# Patient Record
Sex: Female | Born: 1995 | Race: Black or African American | Hispanic: No | Marital: Single | Smoking: Never smoker
Health system: Southern US, Community
[De-identification: ages and names within clinical notes are randomized; demographics above are authoritative.]

## PROBLEM LIST (undated history)

## (undated) ENCOUNTER — Inpatient Hospital Stay (HOSPITAL_COMMUNITY): Payer: Self-pay

## (undated) DIAGNOSIS — E669 Obesity, unspecified: Secondary | ICD-10-CM

## (undated) DIAGNOSIS — G43909 Migraine, unspecified, not intractable, without status migrainosus: Secondary | ICD-10-CM

## (undated) DIAGNOSIS — G932 Benign intracranial hypertension: Secondary | ICD-10-CM

## (undated) HISTORY — DX: Migraine, unspecified, not intractable, without status migrainosus: G43.909

## (undated) HISTORY — DX: Benign intracranial hypertension: G93.2

## (undated) HISTORY — PX: OTHER SURGICAL HISTORY: SHX169

## (undated) HISTORY — DX: Obesity, unspecified: E66.9

---

## 2015-11-02 ENCOUNTER — Emergency Department (HOSPITAL_COMMUNITY): Payer: Managed Care, Other (non HMO)

## 2015-11-02 ENCOUNTER — Encounter (HOSPITAL_COMMUNITY): Payer: Self-pay | Admitting: Emergency Medicine

## 2015-11-02 ENCOUNTER — Emergency Department (HOSPITAL_COMMUNITY)
Admission: EM | Admit: 2015-11-02 | Discharge: 2015-11-03 | Disposition: A | Discharge status: Home / Self Care | Payer: Managed Care, Other (non HMO) | Attending: Emergency Medicine | Admitting: Emergency Medicine

## 2015-11-02 DIAGNOSIS — R51 Headache: Secondary | ICD-10-CM | POA: Insufficient documentation

## 2015-11-02 DIAGNOSIS — M542 Cervicalgia: Secondary | ICD-10-CM | POA: Insufficient documentation

## 2015-11-02 DIAGNOSIS — R202 Paresthesia of skin: Secondary | ICD-10-CM | POA: Insufficient documentation

## 2015-11-02 DIAGNOSIS — R519 Headache, unspecified: Secondary | ICD-10-CM

## 2015-11-02 DIAGNOSIS — R200 Anesthesia of skin: Secondary | ICD-10-CM | POA: Insufficient documentation

## 2015-11-02 LAB — I-STAT CHEM 8, ED
BUN: 18 mg/dL (ref 6–20)
CALCIUM ION: 1.23 mmol/L (ref 1.12–1.23)
Chloride: 102 mmol/L (ref 101–111)
Creatinine, Ser: 0.9 mg/dL (ref 0.44–1.00)
GLUCOSE: 82 mg/dL (ref 65–99)
HCT: 44 % (ref 36.0–46.0)
HEMOGLOBIN: 15 g/dL (ref 12.0–15.0)
POTASSIUM: 3.7 mmol/L (ref 3.5–5.1)
Sodium: 142 mmol/L (ref 135–145)
TCO2: 25 mmol/L (ref 0–100)

## 2015-11-02 MED ORDER — DIAZEPAM 5 MG/ML IJ SOLN
2.5000 mg | Freq: Once | INTRAMUSCULAR | Status: AC
  Administered 2015-11-02: 2.5 mg via INTRAVENOUS
  Filled 2015-11-02: qty 2

## 2015-11-02 MED ORDER — DEXAMETHASONE SODIUM PHOSPHATE 10 MG/ML IJ SOLN
10.0000 mg | Freq: Once | INTRAMUSCULAR | Status: AC
  Administered 2015-11-03: 10 mg via INTRAVENOUS
  Filled 2015-11-02: qty 1

## 2015-11-02 MED ORDER — METOCLOPRAMIDE HCL 5 MG/ML IJ SOLN
10.0000 mg | Freq: Once | INTRAMUSCULAR | Status: AC
  Administered 2015-11-03: 10 mg via INTRAVENOUS
  Filled 2015-11-02: qty 2

## 2015-11-02 MED ORDER — DIPHENHYDRAMINE HCL 50 MG/ML IJ SOLN
25.0000 mg | Freq: Once | INTRAMUSCULAR | Status: AC
  Administered 2015-11-03: 25 mg via INTRAVENOUS
  Filled 2015-11-02: qty 1

## 2015-11-02 MED ORDER — SODIUM CHLORIDE 0.9 % IV BOLUS (SEPSIS)
1000.0000 mL | Freq: Once | INTRAVENOUS | Status: AC
  Administered 2015-11-03: 1000 mL via INTRAVENOUS

## 2015-11-02 MED ORDER — GADOBENATE DIMEGLUMINE 529 MG/ML IV SOLN
15.0000 mL | Freq: Once | INTRAVENOUS | Status: AC | PRN
  Administered 2015-11-02: 15 mL via INTRAVENOUS

## 2015-11-02 NOTE — ED Notes (Signed)
Pt sts HA with numbness down right arm upon waking today

## 2015-11-02 NOTE — ED Notes (Signed)
Patient transported to MRI 

## 2015-11-02 NOTE — ED Provider Notes (Signed)
CSN: [id number]     Arrival date & time 11/02/15  1711 History   First MD Initiated Contact with Patient 11/02/15 2046     Chief Complaint  Patient presents with  . Headache     (Consider location/radiation/quality/duration/timing/severity/associated sxs/prior Treatment) Patient is a 20 y.o. female presenting with headaches. The history is provided by the patient.  Headache Pain location:  Generalized Radiates to:  Does not radiate Severity currently:  8/10 Severity at highest:  8/10 Onset quality:  Sudden Timing:  Constant Progression:  Unchanged Chronicity:  New Similar to prior headaches: yes   Relieved by:  Nothing Worsened by:  Nothing Ineffective treatments:  None tried Associated symptoms: neck pain   Associated symptoms: no abdominal pain, no back pain, no congestion, no cough, no fatigue, no fever, no nausea, no neck stiffness, no vomiting and no weakness   Risk factors: no family hx of SAH     History reviewed. No pertinent past medical history. History reviewed. No pertinent past surgical history. History reviewed. No pertinent family history. Social History  Substance Use Topics  . Smoking status: Never Smoker   . Smokeless tobacco: None  . Alcohol Use: No   OB History    No data available     Review of Systems  Constitutional: Negative for fever and fatigue.  HENT: Negative for congestion and rhinorrhea.   Eyes: Negative for visual disturbance.  Respiratory: Negative for cough and shortness of breath.   Cardiovascular: Negative for chest pain and leg swelling.  Gastrointestinal: Negative for nausea, vomiting and abdominal pain.  Genitourinary: Negative for dysuria and flank pain.  Musculoskeletal: Positive for neck pain. Negative for back pain and neck stiffness.  Skin: Negative for rash.  Allergic/Immunologic: Negative for immunocompromised state.  Neurological: Positive for headaches. Negative for syncope and weakness.      Allergies  Review  of patient's allergies indicates no known allergies.  Home Medications   Prior to Admission medications   Not on File   BP 108/70 mmHg  Pulse 68  Temp(Src) 98.2 F (36.8 C) (Oral)  Resp 18  SpO2 97%  LMP 10/12/2015 Physical Exam  Constitutional: She is oriented to person, place, and time. She appears well-developed and well-nourished. No distress.  HENT:  Head: Normocephalic.  Mouth/Throat: No oropharyngeal exudate.  Eyes: Conjunctivae are normal. Pupils are equal, round, and reactive to light.  Neck: Normal range of motion. Neck supple.  Cardiovascular: Normal rate, regular rhythm, normal heart sounds and intact distal pulses.  Exam reveals no friction rub.   No murmur heard. Pulmonary/Chest: Effort normal and breath sounds normal. No respiratory distress. She has no wheezes. She has no rales.  Abdominal: Soft. Bowel sounds are normal. She exhibits no distension. There is no tenderness.  Musculoskeletal: She exhibits no edema.  Moderate tenderness to palpation of her right paraspinal muscles and superior trapezius. No midline tenderness or deformity.  Neurological: She is alert and oriented to person, place, and time.  Skin: Skin is warm. No rash noted.  Nursing note and vitals reviewed.   Neurological Exam:  - Mental Status: Alert and oriented to person, place, and time. Attention and concentration normal. Speech clear. Recent memory is intact. - Cranial Nerves: Visual fields intact to confrontation in all quadrants bilaterally. EOMI and PERRLA. No nystagmus noted. Facial sensation intact at forehead, maxillary cheek, and chin/mandible bilaterally. No weakness of masticatory muscles. No facial asymmetry or weakness. Hearing grossly normal to finer rub. Uvula is midline, and palate elevates symmetrically. Normal  SCM and trapezius strength. Tongue midline without fasciculations - Motor: Muscle strength 5/5 in proximal and distal UE and LE bilaterally. No pronator drift. Muscle tone  normal. - Reflexes: 2+ and symmetrical in all four extremities.  - Sensation: Intact to light touch and pinprick in upper and lower extremities distally bilaterally.  - Gait: Normal without ataxia. - Coordination: Normal FTN and HTS bilaterally.   ED Course  Procedures (including critical care time) Labs Review Labs Reviewed  I-STAT CHEM 8, ED  POC URINE PREG, ED    Imaging Review Mr Laqueta Jean Wo Contrast  11/02/2015  CLINICAL DATA:  Headache with numbness and tingling of RIGHT arm. EXAM: MRI HEAD WITHOUT AND WITH CONTRAST MRI CERVICAL SPINE WITHOUT AND WITH CONTRAST TECHNIQUE: Multiplanar, multiecho pulse sequences of the brain and surrounding structures, and cervical spine, to include the craniocervical junction and cervicothoracic junction, were obtained without and with intravenous contrast. CONTRAST:  15mL MULTIHANCE GADOBENATE DIMEGLUMINE 529 MG/ML IV SOLN COMPARISON:  None. FINDINGS: MRI HEAD FINDINGS The ventricles and sulci are normal for patient's age. No abnormal parenchymal signal, mass lesions, mass effect. No abnormal parenchymal enhancement. No reduced diffusion to suggest acute ischemia. No susceptibility artifact to suggest hemorrhage. No abnormal extra-axial fluid collections. No extra-axial masses nor leptomeningeal enhancement. Normal major intracranial vascular flow voids seen at the skull base. Somewhat patulous optic nerve sheaths, and slight flattening of the optic discs. No suspicious calvarial bone marrow signal. No abnormal sellar expansion though, there is a convex margin of the pituitary gland. Craniocervical junction maintained. Moderate paranasal sinus mucosal thickening. Mastoid air cells are well aerated. MRI CERVICAL SPINE FINDINGS Cervical vertebral bodies and posterior elements appear intact and aligned with straightened cervical lordosis. Intervertebral discs demonstrate normal morphology and signal characteristics. No abnormal osseous or intradiscal enhancement.  Cervical spinal cord is normal morphology and signal from the craniocervical junction to level of T3-4, the most caudal well visualized level. No abnormal cord, leptomeningeal or epidural enhancement. Scattered prominent cervical lymph nodes are likely reactive. C2-3 through C7-T1: No disc bulge, canal stenosis or neural foraminal narrowing. IMPRESSION: MRI BRAIN: Borderline findings for intracranial hypertension. Otherwise normal MRI of the brain with and without contrast. Moderate paranasal sinusitis. MRI CERVICAL SPINE:  Normal. Electronically Signed   By: Awilda Metro M.D.   On: 11/02/2015 23:26   Mr Cervical Spine W Wo Contrast  11/02/2015  CLINICAL DATA:  Headache with numbness and tingling of RIGHT arm. EXAM: MRI HEAD WITHOUT AND WITH CONTRAST MRI CERVICAL SPINE WITHOUT AND WITH CONTRAST TECHNIQUE: Multiplanar, multiecho pulse sequences of the brain and surrounding structures, and cervical spine, to include the craniocervical junction and cervicothoracic junction, were obtained without and with intravenous contrast. CONTRAST:  15mL MULTIHANCE GADOBENATE DIMEGLUMINE 529 MG/ML IV SOLN COMPARISON:  None. FINDINGS: MRI HEAD FINDINGS The ventricles and sulci are normal for patient's age. No abnormal parenchymal signal, mass lesions, mass effect. No abnormal parenchymal enhancement. No reduced diffusion to suggest acute ischemia. No susceptibility artifact to suggest hemorrhage. No abnormal extra-axial fluid collections. No extra-axial masses nor leptomeningeal enhancement. Normal major intracranial vascular flow voids seen at the skull base. Somewhat patulous optic nerve sheaths, and slight flattening of the optic discs. No suspicious calvarial bone marrow signal. No abnormal sellar expansion though, there is a convex margin of the pituitary gland. Craniocervical junction maintained. Moderate paranasal sinus mucosal thickening. Mastoid air cells are well aerated. MRI CERVICAL SPINE FINDINGS Cervical  vertebral bodies and posterior elements appear intact and aligned with straightened cervical  lordosis. Intervertebral discs demonstrate normal morphology and signal characteristics. No abnormal osseous or intradiscal enhancement. Cervical spinal cord is normal morphology and signal from the craniocervical junction to level of T3-4, the most caudal well visualized level. No abnormal cord, leptomeningeal or epidural enhancement. Scattered prominent cervical lymph nodes are likely reactive. C2-3 through C7-T1: No disc bulge, canal stenosis or neural foraminal narrowing. IMPRESSION: MRI BRAIN: Borderline findings for intracranial hypertension. Otherwise normal MRI of the brain with and without contrast. Moderate paranasal sinusitis. MRI CERVICAL SPINE:  Normal. Electronically Signed   By: Awilda Metroourtnay  Bloomer M.D.   On: 11/02/2015 23:26   I have personally reviewed and evaluated these images and lab results as part of my medical decision-making.   EKG Interpretation None      MDM   20 yo F with PMHx of neck pain x 1 year, intermittent HA who p/w generalized HA, right-sided neck pain and paresthesias down RUE. VSS and WNL. Exam as above, with no apparent focal neuro deficits. Pt does have moderate TTP over right paraspinal cervical muscles. At this time, DDx includes: complex/atypical migraine, cervical radiculopathy, paraspinal muscles spasm with brachial plexus irritation. No fevers, photophobia, and pt o/w well appearing - do not suspect meningitis or encephalitis. HA began gradually, pt well appearing, doubt SAH. Given age, new onset HA and neuro deficits, will check MRI Head/C-Spine.  MR Brain and C-Spine shows no acute abnormality. Pt with borderline findings of IIH. Vision normal bilaterally. Sx improved with migraine cocktail. Suspect possible complex migraine, muscle spasm, versus mild IIH though no other findings to suggest IIH at his time. This was discussed with pt and family and will plan to refer  to outpt neurologist for further work-up. Pt advised to return with any new neuro sx, vision changes, fever, etc.   Clinical Impression: 1. Nonintractable headache   2. Numbness and tingling of right arm     Disposition: Discharge  Condition: Good  I have discussed the results, Dx and Tx plan with the pt(& family if present). He/she/they expressed understanding and agree(s) with the plan. Discharge instructions discussed at great length. Strict return precautions discussed and pt &/or family have verbalized understanding of the instructions. No further questions at time of discharge.    There are no discharge medications for this patient.   [facility] [address]     [address] [place] [id number] [phone]  Call to set up an appointment to discuss your MRI findings and chronic headaches   Pt seen in conjunction with Dr. Selena LesserSchlossman   Magaret Justo, MD 11/03/15 1239  Alvira MondayErin Schlossman, MD 11/09/15 1458

## 2015-11-03 NOTE — ED Notes (Signed)
MD at bedside. 

## 2015-11-07 ENCOUNTER — Ambulatory Visit (INDEPENDENT_AMBULATORY_CARE_PROVIDER_SITE_OTHER): Payer: Managed Care, Other (non HMO) | Admitting: Neurology

## 2015-11-07 ENCOUNTER — Encounter: Payer: Self-pay | Admitting: Neurology

## 2015-11-07 VITALS — BP 114/69 | HR 90 | Ht 63.0 in | Wt 189.5 lb

## 2015-11-07 DIAGNOSIS — G441 Vascular headache, not elsewhere classified: Secondary | ICD-10-CM

## 2015-11-07 DIAGNOSIS — R51 Headache: Secondary | ICD-10-CM

## 2015-11-07 DIAGNOSIS — R519 Headache, unspecified: Secondary | ICD-10-CM | POA: Insufficient documentation

## 2015-11-07 MED ORDER — TOPIRAMATE 25 MG PO TABS: ORAL_TABLET | ORAL | Status: DC

## 2015-11-07 MED ORDER — RIZATRIPTAN BENZOATE 10 MG PO TBDP: 10.0000 mg | ORAL_TABLET | Freq: Three times a day (TID) | ORAL | Status: DC | PRN

## 2015-11-07 NOTE — Patient Instructions (Signed)
 Topamax (topiramate) is a seizure medication that has an FDA approval for seizures and for migraine headache. Potential side effects of this medication include weight loss, cognitive slowing, tingling in the fingers and toes, and carbonated drinks will taste bad. If any significant side effects are noted on this drug, please contact our office.  Migraine Headache A migraine headache is an intense, throbbing pain on one or both sides of your head. A migraine can last for 30 minutes to several hours. CAUSES  The exact cause of a migraine headache is not always known. However, a migraine may be caused when nerves in the brain become irritated and release chemicals that cause inflammation. This causes pain. Certain things may also trigger migraines, such as:  Alcohol.  Smoking.  Stress.  Menstruation.  Aged cheeses.  Foods or drinks that contain nitrates, glutamate, aspartame, or tyramine.  Lack of sleep.  Chocolate.  Caffeine.  Hunger.  Physical exertion.  Fatigue.  Medicines used to treat chest pain (nitroglycerine), birth control pills, estrogen, and some blood pressure medicines. SIGNS AND SYMPTOMS  Pain on one or both sides of your head.  Pulsating or throbbing pain.  Severe pain that prevents daily activities.  Pain that is aggravated by any physical activity.  Nausea, vomiting, or both.  Dizziness.  Pain with exposure to bright lights, loud noises, or activity.  General sensitivity to bright lights, loud noises, or smells. Before you get a migraine, you may get warning signs that a migraine is coming (aura). An aura may include:  Seeing flashing lights.  Seeing bright spots, halos, or zigzag lines.  Having tunnel vision or blurred vision.  Having feelings of numbness or tingling.  Having trouble talking.  Having muscle weakness. DIAGNOSIS  A migraine headache is often diagnosed based on:  Symptoms.  Physical exam.  A CT scan or MRI of your  head. These imaging tests cannot diagnose migraines, but they can help rule out other causes of headaches. TREATMENT Medicines may be given for pain and nausea. Medicines can also be given to help prevent recurrent migraines.  HOME CARE INSTRUCTIONS  Only take over-the-counter or prescription medicines for pain or discomfort as directed by your health care provider. The use of long-term narcotics is not recommended.  Lie down in a dark, quiet room when you have a migraine.  Keep a journal to find out what may trigger your migraine headaches. For example, write down:  What you eat and drink.  How much sleep you get.  Any change to your diet or medicines.  Limit alcohol consumption.  Quit smoking if you smoke.  Get 7-9 hours of sleep, or as recommended by your health care provider.  Limit stress.  Keep lights dim if bright lights bother you and make your migraines worse. SEEK IMMEDIATE MEDICAL CARE IF:   Your migraine becomes severe.  You have a fever.  You have a stiff neck.  You have vision loss.  You have muscular weakness or loss of muscle control.  You start losing your balance or have trouble walking.  You feel faint or pass out.  You have severe symptoms that are different from your first symptoms. MAKE SURE YOU:   Understand these instructions.  Will watch your condition.  Will get help right away if you are not doing well or get worse.   This information is not intended to replace advice given to you by your health care provider. Make sure you discuss any questions you have with your   health care provider.   Document Released: 07/02/2005 Document Revised: 07/23/2014 Document Reviewed: 03/09/2013 Elsevier Interactive Patient Education 2016 [employer]  

## 2015-11-07 NOTE — Progress Notes (Signed)
Reason for visit: Headache  Referring physician: [place]  Erin Barry is a 21 y.o. female  History of present illness:  Erin Barry is a 20 year old right-handed black female with a history of headaches that date back at least one year. The patient indicates that the headaches have become more frequent recently, she is now having about 15 days a month with headache. The headaches generally will come on around the right occipital area and spread down into the right neck, occasionally with some tingling sensations into the right hand and arm. The patient may have photophobia and phonophobia with the headache, she denies any nausea or vomiting. She does have some neck stiffness at times. Occasion, she may have sharp shooting pain in the back of the head when she turns her head from one side to the other. The patient denies any weakness of the extremities, she has not had any balance issues. She reports no visual changes or loss of vision with the headache. She takes ibuprofen with good improvement, but recently was seen through the emergency room because of more significant problems with headaches. The headaches may last up to 2 days. MRI evaluation of the brain and MRI of the cervical spine were relatively unremarkable. She is sent to this office for further evaluation and management.  Past Medical History  Diagnosis Date  . Migraine   . Headache 11/07/2015  . Obese     History reviewed. No pertinent past surgical history.  Family History  Problem Relation Age of Onset  . High blood pressure Maternal Grandfather   . High blood pressure Maternal Grandmother   . Migraines Mother     Social history:  reports that she has never smoked. She does not have any smokeless tobacco history on file. She reports that she does not drink alcohol or use illicit drugs.  Medications:  Prior to Admission medications   Medication Sig Start Date End Date Taking? Authorizing Provider  ibuprofen  (ADVIL,MOTRIN) 800 MG tablet Take 800 mg by mouth every 8 (eight) hours as needed.   Yes Historical Provider, MD     No Known Allergies  ROS:  Out of a complete 14 system review of symptoms, the patient complains only of the following symptoms, and all other reviewed systems are negative.  Headache Neck discomfort, neck stiffness  Blood pressure 114/69, pulse 90, height  (1.6 m), weight 189 lb 8 oz (85.957 kg), last menstrual period 10/12/2015.  Physical Exam  General: The patient is alert and cooperative at the time of the examination. The patient is moderately obese.  Eyes: Pupils are equal, round, and reactive to light. Discs are notable for some mild blurring of the medial borders bilaterally. No venous pulsations are seen.  Neck: The neck is supple, no carotid bruits are noted.  Respiratory: The respiratory examination is clear.  Cardiovascular: The cardiovascular examination reveals a regular rate and rhythm, no obvious murmurs or rubs are noted.  Neuromuscular: Range of movement of the cervical spine is full.  Skin: Extremities are without significant edema.  Neurologic Exam  Mental status: The patient is alert and oriented x 3 at the time of the examination. The patient has apparent normal recent and remote memory, with an apparently normal attention span and concentration ability.  Cranial nerves: Facial symmetry is present. There is good sensation of the face to pinprick and soft touch bilaterally. The strength of the facial muscles and the muscles to head turning and shoulder shrug are normal  bilaterally. Speech is well enunciated, no aphasia or dysarthria is noted. Extraocular movements are full. Visual fields are full. The tongue is midline, and the patient has symmetric elevation of the soft palate. No obvious hearing deficits are noted.  Motor: The motor testing reveals 5 over 5 strength of all 4 extremities. Good symmetric motor tone is noted  throughout.  Sensory: Sensory testing is intact to pinprick, soft touch, vibration sensation, and position sense on all 4 extremities. No evidence of extinction is noted.  Coordination: Cerebellar testing reveals good finger-nose-finger and heel-to-shin bilaterally.  Gait and station: Gait is normal. Tandem gait is normal. Romberg is negative. No drift is seen.  Reflexes: Deep tendon reflexes are symmetric and normal bilaterally. Toes are downgoing bilaterally.   Assessment/Plan:  1. Headache, probable migraine  The patient is reporting frequent migraine type headaches coming out of the right occipital area of the head associated with some neck stiffness, some sensory symptoms down the right arm. The patient may have some muscle spasm associated with the headaches with occasional sharp jabs of pain consistent with an occipital nerve involvement. The patient will be placed on Topamax, and Maxalt will be added. If the headaches do not abate, we may consider a lumbar puncture to fully exclude elevated intracranial pressure. The patient will follow-up in 3 months.  Marlan Palau. Keith Willis MD 11/07/2015 8:41 PM  [facility] [address] [place] [id number]  Phone [phone] Fax [phone]

## 2016-02-01 ENCOUNTER — Ambulatory Visit (INDEPENDENT_AMBULATORY_CARE_PROVIDER_SITE_OTHER): Payer: Managed Care, Other (non HMO) | Admitting: Adult Health

## 2016-02-01 ENCOUNTER — Encounter: Payer: Self-pay | Admitting: Adult Health

## 2016-02-01 VITALS — BP 101/70 | HR 77 | Ht 63.0 in | Wt 190.0 lb

## 2016-02-01 DIAGNOSIS — R519 Headache, unspecified: Secondary | ICD-10-CM

## 2016-02-01 DIAGNOSIS — R51 Headache: Secondary | ICD-10-CM

## 2016-02-01 NOTE — Progress Notes (Signed)
I have read the note, and I agree with the clinical assessment and plan.  Quamir Willemsen KEITH   

## 2016-02-01 NOTE — Progress Notes (Signed)
PATIENT: Erin Barry DOB: Feb 22, 1996  REASON FOR VISIT: follow up-headache HISTORY FROM: patient  HISTORY OF PRESENT ILLNESS: [name] is a 20 year old female with a history of headaches. She returns today for follow-up. She was started on Topamax and Maxalt. She reports that her headaches have improved with Topamax. She states that she was having headaches daily however now that has reduced to maybe 1 headache a week. Her headache continues to be located in the right occipital area extending into the neck. She does have photophobia and phonophobia but denies nausea and vomiting. She states that when she uses Maxalt her headache typically resolves within 30 minutes. She denies any new neurological symptoms. She returns today for an evaluation.  HISTORY 11/07/15:[name] is a 20 year old right-handed black female with a history of headaches that date back at least one year. The patient indicates that the headaches have become more frequent recently, she is now having about 15 days a month with headache. The headaches generally will come on around the right occipital area and spread down into the right neck, occasionally with some tingling sensations into the right hand and arm. The patient may have photophobia and phonophobia with the headache, she denies any nausea or vomiting. She does have some neck stiffness at times. Occasion, she may have sharp shooting pain in the back of the head when she turns her head from one side to the other. The patient denies any weakness of the extremities, she has not had any balance issues. She reports no visual changes or loss of vision with the headache. She takes ibuprofen with good improvement, but recently was seen through the emergency room because of more significant problems with headaches. The headaches may last up to 2 days. MRI evaluation of the brain and MRI of the cervical spine were relatively unremarkable. She is sent to this office for further  evaluation and management.   REVIEW OF SYSTEMS: Out of a complete 14 system review of symptoms, the patient complains only of the following symptoms, and all other reviewed systems are negative.  See history of present illness  ALLERGIES: No Known Allergies  HOME MEDICATIONS: Outpatient Prescriptions Prior to Visit  Medication Sig Dispense Refill  . rizatriptan (MAXALT-MLT) 10 MG disintegrating tablet Take 1 tablet (10 mg total) by mouth 3 (three) times daily as needed for migraine. 9 tablet 3  . topiramate (TOPAMAX) 25 MG tablet Take one tablet at night for one week, then take 2 tablets at night for one week, then take 3 tablets at night. 90 tablet 3  . ibuprofen (ADVIL,MOTRIN) 800 MG tablet Take 800 mg by mouth every 8 (eight) hours as needed. Reported on 02/01/2016     No facility-administered medications prior to visit.    PAST MEDICAL HISTORY: Past Medical History  Diagnosis Date  . Migraine   . Headache 11/07/2015  . Obese     PAST SURGICAL HISTORY: No past surgical history on file.  FAMILY HISTORY: Family History  Problem Relation Age of Onset  . High blood pressure Maternal Grandfather   . High blood pressure Maternal Grandmother   . Migraines Mother     SOCIAL HISTORY: Social History   Social History  . Marital Status: Single    Spouse Name: N/A  . Number of Children: 0  . Years of Education: 12   Occupational History  . Sales associate     [employer]   Social History Main Topics  . Smoking status: Never Smoker   .  Smokeless tobacco: Not on file  . Alcohol Use: No  . Drug Use: No  . Sexual Activity: Not on file   Other Topics Concern  . Not on file   Social History Narrative   Lives at home   Single   Right-handed   Drinks about 2 soft drinks per day      PHYSICAL EXAM  Filed Vitals:   02/01/16 0918  BP: 101/70  Pulse: 77  Height: 5\' 3"  (1.6 m)  Weight: 190 lb (86.183 kg)   Body mass index is 33.67 kg/(m^2).  Generalized: Well  developed, in no acute distress   Neurological examination  Mentation: Alert oriented to time, place, history taking. Follows all commands speech and language fluent Cranial nerve II-XII: Pupils were equal round reactive to light. Extraocular movements were full, visual field were full on confrontational test. Facial sensation and strength were normal. Uvula tongue midline. Head turning and shoulder shrug  were normal and symmetric. Motor: The motor testing reveals 5 over 5 strength of all 4 extremities. Good symmetric motor tone is noted throughout.  Sensory: Sensory testing is intact to soft touch on all 4 extremities. No evidence of extinction is noted.  Coordination: Cerebellar testing reveals good finger-nose-finger and heel-to-shin bilaterally.  Gait and station: Gait is normal. Tandem gait is normal. Romberg is negative. No drift is seen.  Reflexes: Deep tendon reflexes are symmetric and normal bilaterally.   DIAGNOSTIC DATA (LABS, IMAGING, TESTING) - I reviewed patient records, labs, notes, testing and imaging myself where available.  Lab Results  Component Value Date   HGB 15.0 11/02/2015   HCT 44.0 11/02/2015      Component Value Date/Time   NA 142 11/02/2015 2151   K 3.7 11/02/2015 2151   CL 102 11/02/2015 2151   GLUCOSE 82 11/02/2015 2151   BUN 18 11/02/2015 2151   CREATININE 0.90 11/02/2015 2151      ASSESSMENT AND PLAN 20 y.o. year old female  has a past medical history of Migraine; Headache (11/07/2015); and Obese. here with:  1. Headache  Overall the patient is doing well. She is responded well to Topamax. She will continue on Topamax 75 mg at bedtime. Continue using Maxalt as needed for acute headache. Patient advised that if her headache frequency or severity increases she should let us know. She will follow-up in 4-5 months or sooner if needed.    Butch PennyMegan Malcom Selmer, MSN, NP-C 02/01/2016, 9:27 AM [facility] [address], [address] [place], [place] [address] [phone]

## 2016-02-01 NOTE — Patient Instructions (Signed)
Continue Topamax 75 mg at bedtime Continue maxalt as needed for acute headache If your symptoms worsen or you develop new symptoms please let us know.

## 2016-06-04 ENCOUNTER — Ambulatory Visit: Payer: Managed Care, Other (non HMO) | Admitting: Adult Health

## 2017-05-06 ENCOUNTER — Encounter: Payer: Managed Care, Other (non HMO) | Admitting: Certified Nurse Midwife

## 2017-05-28 LAB — OB RESULTS CONSOLE ABO/RH: RH Type: POSITIVE

## 2017-05-28 LAB — GLUCOSE, 1 HOUR: Glucose 1 Hour: 97

## 2017-05-28 LAB — OB RESULTS CONSOLE HGB/HCT, BLOOD
HEMATOCRIT: 33
HEMOGLOBIN: 10.8

## 2017-05-28 LAB — OB RESULTS CONSOLE HEPATITIS B SURFACE ANTIGEN: Hepatitis B Surface Ag: NEGATIVE

## 2017-05-28 LAB — OB RESULTS CONSOLE VARICELLA ZOSTER ANTIBODY, IGG: Varicella: IMMUNE

## 2017-05-28 LAB — OB RESULTS CONSOLE ANTIBODY SCREEN: Antibody Screen: NEGATIVE

## 2017-05-28 LAB — OB RESULTS CONSOLE GC/CHLAMYDIA
CHLAMYDIA, DNA PROBE: NEGATIVE
GC PROBE AMP, GENITAL: NEGATIVE

## 2017-05-28 LAB — OB RESULTS CONSOLE RPR: RPR: NONREACTIVE

## 2017-05-28 LAB — CYTOLOGY - PAP: Pap: NEGATIVE

## 2017-05-28 LAB — CULTURE, OB URINE: Urine Culture, OB: NEGATIVE

## 2017-05-28 LAB — SICKLE CELL SCREEN: SICKLE CELL SCREEN: NORMAL

## 2017-05-28 LAB — OB RESULTS CONSOLE HIV ANTIBODY (ROUTINE TESTING): HIV: NONREACTIVE

## 2017-05-28 LAB — OB RESULTS CONSOLE RUBELLA ANTIBODY, IGM: Rubella: NON-IMMUNE/NOT IMMUNE

## 2017-05-28 LAB — CYSTIC FIBROSIS DIAGNOSTIC STUDY: INTERPRETATION-CFDNA: NEGATIVE

## 2017-07-16 NOTE — L&D Delivery Note (Signed)
Delivery Note At 1:42 PM a viable and healthy female was delivered via Vaginal, Spontaneous (Presentation: cephalid; LOA  ).  APGAR: 8, 9; Placenta status: intact , spontaneous .  Cord: 3 vessel with no complications  Anesthesia:  none Episiotomy: None Lacerations:  Bilateral sulcus, repaired with two simple interrupted sutures on each side Suture Repair: 3.0 vicryl Est. Blood Loss (mL):    Mom to postpartum.  Baby to Couplet care / Skin to Skin.  Myrene BuddyJacob Verda Mehta 10/24/2017, 2:07 PM

## 2017-08-07 ENCOUNTER — Inpatient Hospital Stay (HOSPITAL_COMMUNITY)
Admission: AD | Admit: 2017-08-07 | Discharge: 2017-08-07 | Disposition: A | Discharge status: Home / Self Care | Payer: Managed Care, Other (non HMO) | Source: Ambulatory Visit | Attending: Obstetrics and Gynecology | Admitting: Obstetrics and Gynecology

## 2017-08-07 ENCOUNTER — Other Ambulatory Visit: Payer: Self-pay

## 2017-08-07 ENCOUNTER — Encounter (HOSPITAL_COMMUNITY): Payer: Self-pay | Admitting: *Deleted

## 2017-08-07 DIAGNOSIS — Z3A28 28 weeks gestation of pregnancy: Secondary | ICD-10-CM | POA: Diagnosis not present

## 2017-08-07 DIAGNOSIS — G43909 Migraine, unspecified, not intractable, without status migrainosus: Secondary | ICD-10-CM | POA: Diagnosis not present

## 2017-08-07 DIAGNOSIS — O26893 Other specified pregnancy related conditions, third trimester: Secondary | ICD-10-CM

## 2017-08-07 DIAGNOSIS — O99213 Obesity complicating pregnancy, third trimester: Secondary | ICD-10-CM | POA: Diagnosis not present

## 2017-08-07 DIAGNOSIS — B373 Candidiasis of vulva and vagina: Secondary | ICD-10-CM | POA: Diagnosis not present

## 2017-08-07 DIAGNOSIS — B379 Candidiasis, unspecified: Secondary | ICD-10-CM

## 2017-08-07 DIAGNOSIS — O98813 Other maternal infectious and parasitic diseases complicating pregnancy, third trimester: Secondary | ICD-10-CM | POA: Insufficient documentation

## 2017-08-07 DIAGNOSIS — O99353 Diseases of the nervous system complicating pregnancy, third trimester: Secondary | ICD-10-CM | POA: Insufficient documentation

## 2017-08-07 DIAGNOSIS — N898 Other specified noninflammatory disorders of vagina: Secondary | ICD-10-CM | POA: Diagnosis not present

## 2017-08-07 DIAGNOSIS — Z0371 Encounter for suspected problem with amniotic cavity and membrane ruled out: Secondary | ICD-10-CM

## 2017-08-07 LAB — URINALYSIS, ROUTINE W REFLEX MICROSCOPIC
Bilirubin Urine: NEGATIVE
GLUCOSE, UA: NEGATIVE mg/dL
HGB URINE DIPSTICK: NEGATIVE
KETONES UR: NEGATIVE mg/dL
Leukocytes, UA: NEGATIVE
Nitrite: NEGATIVE
Protein, ur: NEGATIVE mg/dL
Specific Gravity, Urine: 1.02 (ref 1.005–1.030)
pH: 6 (ref 5.0–8.0)

## 2017-08-07 LAB — WET PREP, GENITAL
Clue Cells Wet Prep HPF POC: NONE SEEN
SPERM: NONE SEEN
Trich, Wet Prep: NONE SEEN
Yeast Wet Prep HPF POC: NONE SEEN

## 2017-08-07 LAB — POCT FERN TEST: POCT FERN TEST: NEGATIVE

## 2017-08-07 LAB — AMNISURE RUPTURE OF MEMBRANE (ROM) NOT AT ARMC: Amnisure ROM: NEGATIVE

## 2017-08-07 MED ORDER — TERCONAZOLE 0.4 % VA CREA: 1.0000 | TOPICAL_CREAM | Freq: Every day | VAGINAL | 0 refills | Status: DC

## 2017-08-07 NOTE — Progress Notes (Signed)
Maternal pulse tracing @ [id number] while pt supine for pelvic exam.

## 2017-08-07 NOTE — Progress Notes (Signed)
Slide obtained for ferning.  Fern negative

## 2017-08-07 NOTE — Discharge Instructions (Signed)

## 2017-08-07 NOTE — MAU Note (Signed)
Pt presents with c/o LOF since 1240 this afternoon.  Reports fluid is clear.  Denies VB or ctxs.  Reports +FM.

## 2017-08-07 NOTE — MAU Provider Note (Signed)
History     CSN: [id number]  Arrival date and time: 08/07/17 1311   First Provider Initiated Contact with Patient 08/07/17 1418      Chief Complaint  Patient presents with  . Possible Pregnancy   HPI   [name] is a 22 y.o. female G1P0 @ 10173w0d here in MAU with complaints of clear fluid leaking, concerned about ROM. States when she got out of the shower this morning she noticed clear fluid leaking from her vagina. States she had to wear a pad that seemed more wet than usual. She denies bleeding, no pain.   OB History    Gravida Para Term Preterm AB Living   1             SAB TAB Ectopic Multiple Live Births                  Past Medical History:  Diagnosis Date  . Headache 11/07/2015  . Migraine   . Obese     Past Surgical History:  Procedure Laterality Date  . NO PAST SURGERIES      Family History  Problem Relation Age of Onset  . Migraines Mother   . High blood pressure Maternal Grandfather   . High blood pressure Maternal Grandmother     Social History   Tobacco Use  . Smoking status: Never Smoker  . Smokeless tobacco: Never Used  Substance Use Topics  . Alcohol use: No  . Drug use: No    Allergies: No Known Allergies  Medications Prior to Admission  Medication Sig Dispense Refill Last Dose  . Prenatal Vit-Fe Fumarate-FA (PRENATAL MULTIVITAMIN) TABS tablet Take 1 tablet by mouth daily at 12 noon.   08/06/2017 at Unknown time   Results for orders placed or performed during the hospital encounter of 08/07/17 (from the past 48 hour(s))  Urinalysis, Routine w reflex microscopic     Status: Abnormal   Collection Time: 08/07/17  1:35 PM  Result Value Ref Range   Color, Urine YELLOW YELLOW   APPearance HAZY (A) CLEAR   Specific Gravity, Urine 1.020 1.005 - 1.030   pH 6.0 5.0 - 8.0   Glucose, UA NEGATIVE NEGATIVE mg/dL   Hgb urine dipstick NEGATIVE NEGATIVE   Bilirubin Urine NEGATIVE NEGATIVE   Ketones, ur NEGATIVE NEGATIVE mg/dL   Protein, ur  NEGATIVE NEGATIVE mg/dL   Nitrite NEGATIVE NEGATIVE   Leukocytes, UA NEGATIVE NEGATIVE  Amnisure rupture [facility] membrane (rom)[facility]     Status: None   Collection Time: 08/07/17  2:32 PM  Result Value Ref Range   Amnisure ROM NEGATIVE   Fern Test     Status: None   Collection Time: 08/07/17  3:07 PM  Result Value Ref Range   POCT Fern Test Negative = intact amniotic membranes   Wet prep, genital     Status: Abnormal   Collection Time: 08/07/17  3:38 PM  Result Value Ref Range   Yeast Wet Prep HPF POC NONE SEEN NONE SEEN   Trich, Wet Prep NONE SEEN NONE SEEN   Clue Cells Wet Prep HPF POC NONE SEEN NONE SEEN   WBC, Wet Prep HPF POC FEW (A) NONE SEEN    Comment: MANY BACTERIA SEEN   Sperm NONE SEEN    Review of Systems  Constitutional: Negative for fever.  Gastrointestinal: Negative for abdominal pain.  Genitourinary: Positive for vaginal discharge. Negative for vaginal bleeding.   Physical Exam   Blood pressure 103/63, pulse 86, temperature  98.9 F (37.2 C), temperature source Oral, resp. rate 16, weight 191 lb 12 oz (87 kg), SpO2 100 %.  Physical Exam  Constitutional: She is oriented to person, place, and time. She appears well-developed and well-nourished.  HENT:  Head: Normocephalic.  Genitourinary:  Genitourinary Comments: Vagina - Small amount of thick white vaginal discharge, no odor. No pooling of fluid. Cervix - No contact bleeding, no active bleeding  Bimanual exam: Cervix closed wet prep done, amnisure  Chaperone present for exam.   Musculoskeletal: Normal range of motion.  Neurological: She is alert and oriented to person, place, and time.  Skin: Skin is warm.  Psychiatric: Her behavior is normal.   Fetal Tracing: Baseline: 140 bpm Variability: Moderate  Accelerations: 10x10 Decelerations: None Toco: None MAU Course  Procedures  None  MDM  Fern slide negative Amnisure negative Wet prep collected   Assessment and Plan   A:  1. Yeast  infection   2. Encounter for suspected premature rupture of amniotic membranes, with rupture of membranes not found   3. Vaginal discharge during pregnancy in third trimester     P:  Discharge home in stable condition Return to MAU if symptoms worsen Rx: Terazol Follow up with OB as scheduled or sooner if needed  Venia Carbon I, NP  08/08/2017 4:42 PM

## 2017-08-07 NOTE — MAU Note (Signed)
Was in the shower and noted clear fluid coming from vagina, got out,dried off- fluid continued. Has on a panty liner now- is damp.  No bleeding, no pain, denies recent intercourse.

## 2017-08-08 LAB — OB RESULTS CONSOLE HGB/HCT, BLOOD
HCT: 31
Hemoglobin: 10.4

## 2017-08-08 LAB — GLUCOSE, 1 HOUR: Glucose 1 Hour: 89

## 2017-08-08 LAB — OB RESULTS CONSOLE HIV ANTIBODY (ROUTINE TESTING): HIV: NONREACTIVE

## 2017-08-08 LAB — OB RESULTS CONSOLE RPR: RPR: NONREACTIVE

## 2017-08-27 ENCOUNTER — Encounter: Payer: Self-pay | Admitting: *Deleted

## 2017-08-28 ENCOUNTER — Ambulatory Visit (INDEPENDENT_AMBULATORY_CARE_PROVIDER_SITE_OTHER): Payer: Managed Care, Other (non HMO) | Admitting: Obstetrics and Gynecology

## 2017-08-28 ENCOUNTER — Encounter: Payer: Self-pay | Admitting: Obstetrics and Gynecology

## 2017-08-28 DIAGNOSIS — O0990 Supervision of high risk pregnancy, unspecified, unspecified trimester: Secondary | ICD-10-CM

## 2017-08-28 DIAGNOSIS — O365990 Maternal care for other known or suspected poor fetal growth, unspecified trimester, not applicable or unspecified: Secondary | ICD-10-CM | POA: Insufficient documentation

## 2017-08-28 NOTE — Patient Instructions (Signed)
Intrauterine Growth Restriction Intrauterine growth restriction (IUGR) is when your baby is not growing normally during your pregnancy. A baby with IUGR is smaller than it should be and may weigh less than normal at birth. IUGR can result if there is a problem with the organ that supplies your baby with oxygen and nutrition (placenta). Usually, there is no way to prevent this type of problem. What are the causes? The most common cause of IUGR is a problem with the placenta or umbilical cord that causes your developing baby to get less oxygen or nutrition than needed. Other causes include:  Poor maternal nutrition.  Chemicals found in substances such as cigarettes, alcohol, and some illegal drugs.  Some prescription medicines.  Congenital defects.  Genetic disorders.  An infection.  Carrying more than one baby, such as twins or triplets (multiple gestations).  What increases the risk? This condition is more likely to develop in women who:  Are over the age of 35 at the time of delivery (advanced maternal age).  Have medical conditions such as high blood pressure, diabetes, heart or kidney disease, anemia, or conditions that increase the risk for blood clotting.  Live at a very high altitude during pregnancy.  Have a personal history or family history of IUGR.  Take medicines during pregnancy that are linked with congenital disabilities.  Have a personal or family history of a genetic disorder.  Come into contact with infected cat feces (toxoplasmosis).  Come into contact with chickenpox (varicella) or German measles (rubella).  Have or are at risk of getting an infectious disease such as syphilis, HIV, or herpes.  Eat poorly during their pregnancy.  Weigh less than 100 pounds.  Have a family history of multiple gestations.  Have had infertility treatments.  Use tobacco, illegal drugs, or drink alcohol during pregnancy.  What are the signs or symptoms? IUGR does not  cause many symptoms. You might notice that your baby does not move or kick very often. Also, your belly may not be as big as expected for the stage of your pregnancy. How is this diagnosed? This condition is diagnosed with physical and prenatal exams. Your health care provider will measure the size of your baby inside your womb during a routine screening exam using sound waves (ultrasound). Your health care provider will compare the size of your baby to the size of other babies at the same stage of development (gestational age). Your health care provider will diagnose IUGR if your baby is smaller than 90 percent of all other babies at the same gestational age. You may also have tests to find the cause of IUGR. These can include:  Having fluid removed from your womb to check for signs of infection or a congenital disability (amniocentesis).  A series of tests to monitor your baby's health (fetal monitoring).  How is this treated? In most cases, treatment for this condition focuses on stopping the cause of your baby's small growth. Your health care providers will monitor your pregnancy closely and help you manage your pregnancy. If this condition is caused by a placenta problem and the baby is not getting enough blood, treatment may include:  Medicine to start labor and deliver your baby early (induction).  Cesarean delivery. In this procedure, your baby is delivered through a cut (incision) in the abdomen and womb (uterus).  Follow these instructions at home:  Make sure you are eating enough calories and gaining enough weight.  Eat a balanced diet. Work with a nutrition specialist (  dietitian), if needed.  Rest as needed. Try to get at least eight hours of sleep every night.  Do not drink alcohol.  Do not use illegal drugs.  Do not use any tobacco products, including cigarettes, chewing tobacco, or e-cigarettes. If you need help quitting, ask your health care provider.  Talk to your  health care provider about steps you can take to avoid infections.  Take medicines, vitamins, and mineral supplements only as directed by your health care provider. Make sure that your health care provider knows about all of the prescribed or over-the-counter medicines, supplements, vitamins, eye drops, and creams that you are using.  Keep all follow-up visits as directed by your health care provider. This is important. Contact a health care provider if:  Your baby is not moving as often as before. This information is not intended to replace advice given to you by your health care provider. Make sure you discuss any questions you have with your health care provider. Document Released: 04/10/2008 Document Revised: 12/08/2015 Document Reviewed: 06/28/2014 Elsevier Interactive Patient Education  2018 [employer] Inc[employer]  

## 2017-08-28 NOTE — Progress Notes (Signed)
Subjective:  Kathline Magicmone Risenhoover is a 22 y.o. G2P0001 at [redacted]w[redacted]d being seen today for ongoing prenatal care. [facility]. U/S on 08/08/17 < 3 %. She has no chronic medical problems or medications. [facility] to this point. She is currently monitored for the following issues for this high-risk pregnancy and has Supervision of high risk pregnancy, antepartum and Symmetric IUGR complicating pregnancy, antepartum on their problem list.  Patient reports no complaints.  Contractions: Not present. Vag. Bleeding: None.  Movement: Present. Denies leaking of fluid.   The following portions of the patient's history were reviewed and updated as appropriate: allergies, current medications, past family history, past medical history, past social history, past surgical history and problem list. Problem list updated.  Objective:   Vitals:   08/28/17 1413  BP: 121/72  Pulse: 89  Weight: 193 lb 8 oz (87.8 kg)    Fetal Status: Fetal Heart Rate (bpm): 158   Movement: Present     General:  Alert, oriented and cooperative. Patient is in no acute distress.  Skin: Skin is warm and dry. No rash noted.   Cardiovascular: Normal heart rate noted  Respiratory: Normal respiratory effort, no problems with respiration noted  Abdomen: Soft, gravid, appropriate for gestational age. Pain/Pressure: Present     Pelvic:  Cervical exam deferred        Extremities: Normal range of motion.  Edema: Trace  Mental Status: Normal mood and affect. Normal behavior. Normal judgment and thought content.   Urinalysis:      Assessment and Plan:  Pregnancy: G2P0001 at 625w0d  1. Supervision of high risk pregnancy, antepartum Stable  2. Symmetric intrauterine growth restriction affecting pregnancy, antepartum, single or unspecified fetus IUGR reviewed with pt. U/S and additional orders as noted - US MFM OB COMP + 14 WK; Future - US MFM FETAL BPP WO NON STRESS; Future - US MFM  UA CORD DOPPLER; Future  Preterm labor symptoms and general obstetric precautions including but not limited to vaginal bleeding, contractions, leaking of fluid and fetal movement were reviewed in detail with the patient. Please refer to After Visit Summary for other counseling recommendations.  No Follow-up on file.   Hermina StaggersErvin, Cathline Dowen L, MD

## 2017-09-03 ENCOUNTER — Ambulatory Visit (INDEPENDENT_AMBULATORY_CARE_PROVIDER_SITE_OTHER): Payer: Managed Care, Other (non HMO) | Admitting: Obstetrics & Gynecology

## 2017-09-03 ENCOUNTER — Encounter (HOSPITAL_COMMUNITY): Payer: Self-pay

## 2017-09-03 ENCOUNTER — Ambulatory Visit (INDEPENDENT_AMBULATORY_CARE_PROVIDER_SITE_OTHER): Payer: Managed Care, Other (non HMO) | Admitting: *Deleted

## 2017-09-03 VITALS — BP 115/67 | HR 94 | Wt 193.5 lb

## 2017-09-03 DIAGNOSIS — O365993 Maternal care for other known or suspected poor fetal growth, unspecified trimester, fetus 3: Secondary | ICD-10-CM | POA: Diagnosis not present

## 2017-09-03 DIAGNOSIS — O365990 Maternal care for other known or suspected poor fetal growth, unspecified trimester, not applicable or unspecified: Secondary | ICD-10-CM | POA: Diagnosis not present

## 2017-09-03 DIAGNOSIS — O0990 Supervision of high risk pregnancy, unspecified, unspecified trimester: Secondary | ICD-10-CM

## 2017-09-03 DIAGNOSIS — O0993 Supervision of high risk pregnancy, unspecified, third trimester: Secondary | ICD-10-CM | POA: Diagnosis not present

## 2017-09-03 NOTE — Progress Notes (Signed)
NST only done today - pt has US tomorrow for Ob detail/BPP/UA doppler

## 2017-09-03 NOTE — Progress Notes (Signed)
   PRENATAL VISIT NOTE  Subjective:  Erin Barry is a 22 y.o. G2P0001 at 6218w6d being seen today for ongoing prenatal care.  She is currently monitored for the following issues for this high-risk pregnancy and has Supervision of high risk pregnancy, antepartum and Symmetric IUGR complicating pregnancy, antepartum on their problem list.  Patient reports no complaints.  Contractions: Not present. Vag. Bleeding: None.  Movement: Present. Denies leaking of fluid.   The following portions of the patient's history were reviewed and updated as appropriate: allergies, current medications, past family history, past medical history, past social history, past surgical history and problem list. Problem list updated.  Objective:   Vitals:   09/03/17 1030  BP: 115/67  Pulse: 94  Weight: 193 lb 8 oz (87.8 kg)    Fetal Status: Fetal Heart Rate (bpm): NST   Movement: Present     General:  Alert, oriented and cooperative. Patient is in no acute distress.  Skin: Skin is warm and dry. No rash noted.   Cardiovascular: Normal heart rate noted  Respiratory: Normal respiratory effort, no problems with respiration noted  Abdomen: Soft, gravid, appropriate for gestational age.  Pain/Pressure: Present     Pelvic: Cervical exam deferred        Extremities: Normal range of motion.     Mental Status:  Normal mood and affect. Normal behavior. Normal judgment and thought content.   Assessment and Plan:  Pregnancy: G2P0001 at 4218w6d  1. Supervision of high risk pregnancy, antepartum   2. Symmetric intrauterine growth restriction affecting pregnancy, antepartum, single or unspecified fetus - follow up MFM us, BPP, dopplers tomorrow  Preterm labor symptoms and general obstetric precautions including but not limited to vaginal bleeding, contractions, leaking of fluid and fetal movement were reviewed in detail with the patient. Please refer to After Visit Summary for other counseling recommendations.  Return in  about 1 week (around 09/10/2017) for NST/BPP and HOB weekly.   Allie BossierMyra C Andrena Margerum, MD

## 2017-09-03 NOTE — Progress Notes (Signed)
Pt reports feeling occasional menstrual type cramping.

## 2017-09-04 ENCOUNTER — Ambulatory Visit (HOSPITAL_COMMUNITY)
Admission: RE | Admit: 2017-09-04 | Discharge: 2017-09-04 | Disposition: A | Discharge status: Home / Self Care | Payer: Medicaid Other | Source: Ambulatory Visit | Attending: Obstetrics and Gynecology | Admitting: Obstetrics and Gynecology

## 2017-09-04 ENCOUNTER — Encounter (HOSPITAL_COMMUNITY): Payer: Self-pay

## 2017-09-04 ENCOUNTER — Other Ambulatory Visit (HOSPITAL_COMMUNITY): Payer: Self-pay | Admitting: *Deleted

## 2017-09-04 DIAGNOSIS — Z3689 Encounter for other specified antenatal screening: Secondary | ICD-10-CM

## 2017-09-04 DIAGNOSIS — O365931 Maternal care for other known or suspected poor fetal growth, third trimester, fetus 1: Secondary | ICD-10-CM | POA: Diagnosis present

## 2017-09-04 DIAGNOSIS — Z3A32 32 weeks gestation of pregnancy: Secondary | ICD-10-CM | POA: Diagnosis not present

## 2017-09-04 DIAGNOSIS — O365990 Maternal care for other known or suspected poor fetal growth, unspecified trimester, not applicable or unspecified: Secondary | ICD-10-CM

## 2017-09-04 DIAGNOSIS — O0990 Supervision of high risk pregnancy, unspecified, unspecified trimester: Secondary | ICD-10-CM

## 2017-09-04 NOTE — Addendum Note (Signed)
Encounter addended by: Ellin SabaKennedy, Baptiste Littler M on: 09/04/2017 2:35 PM  Actions taken: Imaging Exam ended

## 2017-09-11 ENCOUNTER — Encounter: Payer: Self-pay | Admitting: Obstetrics & Gynecology

## 2017-09-11 ENCOUNTER — Ambulatory Visit: Payer: Self-pay

## 2017-09-11 ENCOUNTER — Ambulatory Visit (INDEPENDENT_AMBULATORY_CARE_PROVIDER_SITE_OTHER): Payer: Managed Care, Other (non HMO) | Admitting: Obstetrics & Gynecology

## 2017-09-11 ENCOUNTER — Ambulatory Visit (INDEPENDENT_AMBULATORY_CARE_PROVIDER_SITE_OTHER): Payer: Managed Care, Other (non HMO) | Admitting: *Deleted

## 2017-09-11 VITALS — BP 123/72 | HR 95 | Wt 192.6 lb

## 2017-09-11 DIAGNOSIS — O365930 Maternal care for other known or suspected poor fetal growth, third trimester, not applicable or unspecified: Secondary | ICD-10-CM | POA: Diagnosis not present

## 2017-09-11 DIAGNOSIS — O365990 Maternal care for other known or suspected poor fetal growth, unspecified trimester, not applicable or unspecified: Secondary | ICD-10-CM | POA: Diagnosis not present

## 2017-09-11 DIAGNOSIS — O0990 Supervision of high risk pregnancy, unspecified, unspecified trimester: Secondary | ICD-10-CM | POA: Diagnosis not present

## 2017-09-11 NOTE — Progress Notes (Signed)
PRENATAL VISIT NOTE  Subjective:  Erin Barry is a 22 y.o. G2P0010 at [redacted]w[redacted]d being seen today for ongoing prenatal care.  She is currently monitored for the following issues for this high-risk pregnancy and has Supervision of high risk pregnancy, antepartum and Symmetric IUGR complicating pregnancy, antepartum on their problem list.  Patient reports no complaints.  Contractions: Not present. Vag. Bleeding: None.  Movement: Present. Denies leaking of fluid.   The following portions of the patient's history were reviewed and updated as appropriate: allergies, current medications, past family history, past medical history, past social history, past surgical history and problem list. Problem list updated.  Objective:   Vitals:   09/11/17 1043  BP: 123/72  Pulse: 95  Weight: 192 lb 9.6 oz (87.4 kg)    Fetal Status: Fetal Heart Rate (bpm): 150 Fundal Height: 31 cm Movement: Present     General:  Alert, oriented and cooperative. Patient is in no acute distress.  Skin: Skin is warm and dry. No rash noted.   Cardiovascular: Normal heart rate noted  Respiratory: Normal respiratory effort, no problems with respiration noted  Abdomen: Soft, gravid, appropriate for gestational age.  Pain/Pressure: Present     Pelvic: Cervical exam deferred        Extremities: Normal range of motion.  Edema: None  Mental Status:  Normal mood and affect. Normal behavior. Normal judgment and thought content.   [place] Mfm Fetal Bpp Wo Non Stress  Result Date: 09/04/2017 ----------------------------------------------------------------------  OBSTETRICS REPORT                      (Signed Final 09/04/2017 02:26 pm) ---------------------------------------------------------------------- Patient Info  ID #:       [id number]                          D.O.B.:  Aug 15, 1995 (21 yrs)  Name:       Erin Barry                    Visit Date: 09/04/2017 02:12 pm ----------------------------------------------------------------------  Performed By  Performed By:     Ellin Saba        Ref. Address:     [facility]                    RD[facility]                                                             OB/Gyn Clinic                                                             [address]                                                             [address]  [place], [place]                                                             [address]  Attending:        Charlsie Merles MD         Location:         [facility]  Referred By:      [facility] for                    [facility]                    Healthcare ---------------------------------------------------------------------- Orders   #  Description                                 Code   1  [place] MFM FETAL BPP WO NON STRESS              60454.09   2  [place] MFM UA CORD DOPPLER                      76820.02   3  [place] MFM OB DETAIL +14 WK                     76811.01  ----------------------------------------------------------------------   #  Ordered By               Order #        Accession #    Episode #   1  Erin Barry            [id number]      [id number]     [id number]   2  Erin Barry            [id number]      [id number]     [id number]   3  Erin Barry            [id number]      [id number]     [id number]  ---------------------------------------------------------------------- Indications   [redacted] weeks gestation of pregnancy                Z3A.32   Encounter for antenatal screening for          Z36.3   malformations   Maternal care for known or suspected poor      O36.5931   fetal growth, third trimester, fetus 1  ---------------------------------------------------------------------- OB History  Gravidity:    2         Term:   0         SAB:   1  Living:       0 ---------------------------------------------------------------------- Fetal Evaluation  Num Of Fetuses:     1  Fetal Heart         153  Rate(bpm):  Cardiac  Activity:   Observed  Presentation:       Cephalic  Placenta:           Posterior, above cervical os  Amniotic Fluid  AFI FV:      Subjectively within  normal limits  AFI Sum(cm)     %Tile       Largest Pocket(cm)  15.54           55          5.19  RUQ(cm)       RLQ(cm)       LUQ(cm)        LLQ(cm)  2.83          3.23          4.29           5.19 ---------------------------------------------------------------------- Biophysical Evaluation  Amniotic F.V:   Within normal limits       F. Tone:        Observed  F. Movement:    Observed                   Score:          8/8  F. Breathing:   Observed ---------------------------------------------------------------------- Biometry  BPD:      74.9  mm     G. Age:  30w 0d          3  %    CI:        75.86   %    70 - 86                                                          FL/HC:      22.2   %    19.1 - 21.3  HC:      272.6  mm     G. Age:  29w 5d        < 3  %    HC/AC:      1.05        0.96 - 1.17  AC:      259.2  mm     G. Age:  30w 1d          7  %    FL/BPD:     80.6   %    71 - 87  FL:       60.4  mm     G. Age:  31w 3d         23  %    FL/AC:      23.3   %    20 - 24  HUM:      53.5  mm     G. Age:  31w 1d         33  %  CER:      38.1  mm     G. Age:  32w 4d         60  %  LV:        4.5  mm  Est. FW:    1578  gm      3 lb 8 oz     26  % ---------------------------------------------------------------------- Gestational Age  LMP:           32w 0d        Date:  01/23/17                 EDD:   10/30/17  U/S Today:     30w 2d  EDD:   11/11/17  Best:          Erin Barry 0d     Det. By:  LMP  (01/23/17)          EDD:   10/30/17 ---------------------------------------------------------------------- Anatomy  Cranium:               Appears normal         Aortic Arch:            Appears normal  Cavum:                 Appears normal         Ductal Arch:            Appears normal  Ventricles:            Appears normal         Diaphragm:               Appears normal  Choroid Plexus:        Appears normal         Stomach:                Appears normal, left                                                                        sided  Cerebellum:            Appears normal         Abdomen:                Appears normal  Posterior Fossa:       Appears normal         Abdominal Wall:         Appears nml (cord                                                                        insert, abd wall)  Nuchal Fold:           Not applicable (>20    Cord Vessels:           Appears normal ([redacted]                         wks GA)                                        vessel cord)  Face:                  Appears normal         Kidneys:                Appear normal                         (orbits and profile)  Lips:                  Appears normal         Bladder:                Appears normal  Thoracic:              Appears normal         Spine:                  Appears normal  Heart:                 Appears normal         Upper Extremities:      Appears normal                         (4CH, axis, and                         situs)  RVOT:                  Appears normal         Lower Extremities:      Appears normal  LVOT:                  Appears normal  Other:  Nasal bone visualized. Heels and 5th digit visualized. Female gender. ---------------------------------------------------------------------- Doppler - Fetal Vessels  Umbilical Artery   S/D     %tile     RI              PI              PSV    ADFV    RDFV                                                   (cm/s)  3.02       66   0.67              1.1             46.23      No      No ---------------------------------------------------------------------- Cervix Uterus Adnexa  Left Ovary  Not visualized.  Right Ovary  Within normal limits. ---------------------------------------------------------------------- Impression  Singleton intrauterine pregnancy at 32+0 weeks with  suspected IUGR here for evaluation  Review of the anatomy  shows no sonographic markers for  aneuploidy or structural anomalies  However, views  should be considered suboptimal secondary  to late gestational age  Amniotic fluid volume is normal  Estimated fetal weight shows growth in the 26th percentile;  [facility] is in the 7th percentile  Normal umbilical artery dopplers with no absent or reversed  end-diastolic flow  BPP 8/8 ---------------------------------------------------------------------- Recommendations  Given the low abdominal circumference recommend repeat  evaluation of growth in 3 weeks ----------------------------------------------------------------------                 Charlsie Merles, MD Electronically Signed Final Report   09/04/2017 02:26 pm ----------------------------------------------------------------------  [place] Mfm Ob Detail +14 Wk  Result Date: 09/04/2017 ----------------------------------------------------------------------  OBSTETRICS REPORT                      (Signed Final 09/04/2017 02:26  pm) ---------------------------------------------------------------------- Patient Info  ID #:       [id number]                          D.O.B.:  04-10-1996 (21 yrs)  Name:       Erin Barry                    Visit Date: 09/04/2017 02:12 pm ---------------------------------------------------------------------- Performed By  Performed By:     Ellin Saba        Ref. Address:     [facility]                                                             OB/Gyn Clinic                                                             [address]                                                             [place], [place]                                                             [address]  Attending:        Charlsie Merles MD         Location:         [facility]  Referred By:      [facility] for                    [facility]                    Healthcare  ---------------------------------------------------------------------- Orders   #  Description                                 Code   1  [place] MFM FETAL BPP WO NON STRESS  [address].60   2  [place] MFM UA CORD DOPPLER                      76820.02   3  [place] MFM OB DETAIL +14 WK                     76811.01  ----------------------------------------------------------------------   #  Ordered By               Order #        Accession #    Episode #   1  Nettie Elm            [id number]      [id number]     [id number]   2  Erin Barry            [id number]      [id number]     [id number]   3  Erin Barry            [id number]      [id number]     [id number]  ---------------------------------------------------------------------- Indications   [redacted] weeks gestation of pregnancy                Z3A.32   Encounter for antenatal screening for          Z36.3   malformations   Maternal care for known or suspected poor      O36.5931   fetal growth, third trimester, fetus 1  ---------------------------------------------------------------------- OB History  Gravidity:    2         Term:   0         SAB:   1  Living:       0 ---------------------------------------------------------------------- Fetal Evaluation  Num Of Fetuses:     1  Fetal Heart         153  Rate(bpm):  Cardiac Activity:   Observed  Presentation:       Cephalic  Placenta:           Posterior, above cervical os  Amniotic Fluid  AFI FV:      Subjectively within normal limits  AFI Sum(cm)     %Tile       Largest Pocket(cm)  15.54           55          5.19  RUQ(cm)       RLQ(cm)       LUQ(cm)        LLQ(cm)  2.83          3.23          4.29           5.19 ---------------------------------------------------------------------- Biophysical Evaluation  Amniotic F.V:   Within normal limits       F. Tone:        Observed  F. Movement:    Observed                   Score:          8/8  F. Breathing:   Observed ----------------------------------------------------------------------  Biometry  BPD:      74.9  mm     G. Age:  30w 0d          3  %    CI:        75.86   %    70 - 86  FL/HC:      22.2   %    19.1 - 21.3  HC:      272.6  mm     G. Age:  29w 5d        < 3  %    HC/AC:      1.05        0.96 - 1.17  AC:      259.2  mm     G. Age:  30w 1d          7  %    FL/BPD:     80.6   %    71 - 87  FL:       60.4  mm     G. Age:  31w 3d         23  %    FL/AC:      23.3   %    20 - 24  HUM:      53.5  mm     G. Age:  31w 1d         33  %  CER:      38.1  mm     G. Age:  32w 4d         60  %  LV:        4.5  mm  Est. FW:    1578  gm      3 lb 8 oz     26  % ---------------------------------------------------------------------- Gestational Age  LMP:           32w 0d        Date:  01/23/17                 EDD:   10/30/17  U/S Today:     30w 2d                                        EDD:   11/11/17  Best:          32w 0d     Det. By:  LMP  (01/23/17)          EDD:   10/30/17 ---------------------------------------------------------------------- Anatomy  Cranium:               Appears normal         Aortic Arch:            Appears normal  Cavum:                 Appears normal         Ductal Arch:            Appears normal  Ventricles:            Appears normal         Diaphragm:              Appears normal  Choroid Plexus:        Appears normal         Stomach:                Appears normal, left  sided  Cerebellum:            Appears normal         Abdomen:                Appears normal  Posterior Fossa:       Appears normal         Abdominal Wall:         Appears nml (cord                                                                        insert, abd wall)  Nuchal Fold:           Not applicable (>20    Cord Vessels:           Appears normal ([redacted]                         wks GA)                                        vessel cord)  Face:                  Appears normal          Kidneys:                Appear normal                         (orbits and profile)  Lips:                  Appears normal         Bladder:                Appears normal  Thoracic:              Appears normal         Spine:                  Appears normal  Heart:                 Appears normal         Upper Extremities:      Appears normal                         (4CH, axis, and                         situs)  RVOT:                  Appears normal         Lower Extremities:      Appears normal  LVOT:                  Appears normal  Other:  Nasal bone visualized. Heels and 5th digit visualized. Female gender. ---------------------------------------------------------------------- Doppler - Fetal Vessels  Umbilical Artery   S/D     %tile     RI  PI              PSV    ADFV    RDFV                                                   (cm/s)  3.02       66   0.67              1.1             46.23      No      No ---------------------------------------------------------------------- Cervix Uterus Adnexa  Left Ovary  Not visualized.  Right Ovary  Within normal limits. ---------------------------------------------------------------------- Impression  Singleton intrauterine pregnancy at 32+0 weeks with  suspected IUGR here for evaluation  Review of the anatomy shows no sonographic markers for  aneuploidy or structural anomalies  However, views  should be considered suboptimal secondary  to late gestational age  Amniotic fluid volume is normal  Estimated fetal weight shows growth in the 26th percentile;  [facility] is in the 7th percentile  Normal umbilical artery dopplers with no absent or reversed  end-diastolic flow  BPP 8/8 ---------------------------------------------------------------------- Recommendations  Given the low abdominal circumference recommend repeat  evaluation of growth in 3 weeks ----------------------------------------------------------------------                 Charlsie Merles, MD Electronically Signed  Final Report   09/04/2017 02:26 pm ----------------------------------------------------------------------  [place] Mfm Ua Cord Doppler  Result Date: 09/04/2017 ----------------------------------------------------------------------  OBSTETRICS REPORT                      (Signed Final 09/04/2017 02:26 pm) ---------------------------------------------------------------------- Patient Info  ID #:       [id number]                          D.O.B.:  [birth year] (21 yrs)  Name:       Erin Barry                    Visit Date: 09/04/2017 02:12 pm ---------------------------------------------------------------------- Performed By  Performed By:     Ellin Saba        Ref. Address:     [facility]                    RDMS                                                             OB/Gyn Clinic                                                             [address]                                                             [place], [place]                                                             [address]  Attending:        Charlsie Merles MD         Location:         [facility]  Referred By:      [facility] for                    [facility]                    Healthcare ---------------------------------------------------------------------- Orders   #  Description                                 Code   1  [place] MFM FETAL BPP WO NON STRESS              60454.09   2  [place] MFM UA CORD DOPPLER                      76820.02   3  [place] MFM OB DETAIL +14 WK                     76811.01  ----------------------------------------------------------------------   #  Ordered By               Order #        Accession #    Episode #   1  Nettie Elm            [id number]      [id number]     [id number]   2  Erin Barry            [id number]      [id number]     [id number]   3  Erin Barry            [id number]      [id number]     [id number]   ---------------------------------------------------------------------- Indications   [redacted] weeks gestation of pregnancy                Z3A.32   Encounter for antenatal screening for          Z36.3   malformations   Maternal care for known or suspected poor      O36.5931   fetal growth, third trimester, fetus 1  ---------------------------------------------------------------------- OB History  Gravidity:    2         Term:   0         SAB:   1  Living:       0 ---------------------------------------------------------------------- Fetal Evaluation  Num Of Fetuses:  1  Fetal Heart         153  Rate(bpm):  Cardiac Activity:   Observed  Presentation:       Cephalic  Placenta:           Posterior, above cervical os  Amniotic Fluid  AFI FV:      Subjectively within normal limits  AFI Sum(cm)     %Tile       Largest Pocket(cm)  15.54           55          5.19  RUQ(cm)       RLQ(cm)       LUQ(cm)        LLQ(cm)  2.83          3.23          4.29           5.19 ---------------------------------------------------------------------- Biophysical Evaluation  Amniotic F.V:   Within normal limits       F. Tone:        Observed  F. Movement:    Observed                   Score:          8/8  F. Breathing:   Observed ---------------------------------------------------------------------- Biometry  BPD:      74.9  mm     G. Age:  30w 0d          3  %    CI:        75.86   %    70 - 86                                                          FL/HC:      22.2   %    19.1 - 21.3  HC:      272.6  mm     G. Age:  29w 5d        < 3  %    HC/AC:      1.05        0.96 - 1.17  AC:      259.2  mm     G. Age:  30w 1d          7  %    FL/BPD:     80.6   %    71 - 87  FL:       60.4  mm     G. Age:  31w 3d         23  %    FL/AC:      23.3   %    20 - 24  HUM:      53.5  mm     G. Age:  31w 1d         33  %  CER:      38.1  mm     G. Age:  32w 4d         60  %  LV:        4.5  mm  Est. FW:    1578  gm      3 lb 8 oz     26  %  ----------------------------------------------------------------------  Gestational Age  LMP:           32w 0d        Date:  01/23/17                 EDD:   10/30/17  U/S Today:     30w 2d                                        EDD:   11/11/17  Best:          32w 0d     Det. By:  LMP  (01/23/17)          EDD:   10/30/17 ---------------------------------------------------------------------- Anatomy  Cranium:               Appears normal         Aortic Arch:            Appears normal  Cavum:                 Appears normal         Ductal Arch:            Appears normal  Ventricles:            Appears normal         Diaphragm:              Appears normal  Choroid Plexus:        Appears normal         Stomach:                Appears normal, left                                                                        sided  Cerebellum:            Appears normal         Abdomen:                Appears normal  Posterior Fossa:       Appears normal         Abdominal Wall:         Appears nml (cord                                                                        insert, abd wall)  Nuchal Fold:           Not applicable (>20    Cord Vessels:           Appears normal ([redacted]                         wks GA)  vessel cord)  Face:                  Appears normal         Kidneys:                Appear normal                         (orbits and profile)  Lips:                  Appears normal         Bladder:                Appears normal  Thoracic:              Appears normal         Spine:                  Appears normal  Heart:                 Appears normal         Upper Extremities:      Appears normal                         (4CH, axis, and                         situs)  RVOT:                  Appears normal         Lower Extremities:      Appears normal  LVOT:                  Appears normal  Other:  Nasal bone visualized. Heels and 5th digit visualized. Female gender.  ---------------------------------------------------------------------- Doppler - Fetal Vessels  Umbilical Artery   S/D     %tile     RI              PI              PSV    ADFV    RDFV                                                   (cm/s)  3.02       66   0.67              1.1             46.23      No      No ---------------------------------------------------------------------- Cervix Uterus Adnexa  Left Ovary  Not visualized.  Right Ovary  Within normal limits. ---------------------------------------------------------------------- Impression  Singleton intrauterine pregnancy at 32+0 weeks with  suspected IUGR here for evaluation  Review of the anatomy shows no sonographic markers for  aneuploidy or structural anomalies  However, views  should be considered suboptimal secondary  to late gestational age  Amniotic fluid volume is normal  Estimated fetal weight shows growth in the 26th percentile;  [facility] is in the 7th percentile  Normal umbilical artery dopplers with no absent or reversed  end-diastolic flow  BPP 8/8 ---------------------------------------------------------------------- Recommendations  Given the low abdominal circumference recommend repeat  evaluation of growth in 3 weeks ----------------------------------------------------------------------                 Charlsie Merles, MD Electronically Signed Final Report   09/04/2017 02:26 pm ----------------------------------------------------------------------   Assessment and Plan:  Pregnancy: G2P0010 at [redacted]w[redacted]d  1. Symmetric intrauterine growth restriction affecting pregnancy, antepartum, single or unspecified fetus Patient to get BPP/NST today, continue antenatal testing and growth scans as recommended by MFM. Will follow up on delivery plans.  2. Supervision of high risk pregnancy, antepartum Preterm labor symptoms and general obstetric precautions including but not limited to vaginal bleeding, contractions, leaking of fluid and fetal movement were  reviewed in detail with the patient. Please refer to After Visit Summary for other counseling recommendations.  Return for BPP, NST, OB Visit (HOB).   Jaynie Collins, MD

## 2017-09-11 NOTE — Progress Notes (Signed)

## 2017-09-11 NOTE — Patient Instructions (Signed)
Return to clinic for any scheduled appointments or obstetric concerns, or go to MAU for evaluation  

## 2017-09-11 NOTE — Addendum Note (Signed)
Addended by: Jill SideAY, DIANE L on: 09/11/2017 12:22 PM   Modules accepted: Orders

## 2017-09-19 ENCOUNTER — Ambulatory Visit (INDEPENDENT_AMBULATORY_CARE_PROVIDER_SITE_OTHER): Payer: Managed Care, Other (non HMO) | Admitting: Obstetrics & Gynecology

## 2017-09-19 ENCOUNTER — Ambulatory Visit (INDEPENDENT_AMBULATORY_CARE_PROVIDER_SITE_OTHER): Payer: Managed Care, Other (non HMO) | Admitting: General Practice

## 2017-09-19 ENCOUNTER — Ambulatory Visit: Payer: Self-pay

## 2017-09-19 VITALS — BP 121/75 | HR 105 | Wt 196.6 lb

## 2017-09-19 DIAGNOSIS — O0990 Supervision of high risk pregnancy, unspecified, unspecified trimester: Secondary | ICD-10-CM | POA: Diagnosis not present

## 2017-09-19 DIAGNOSIS — O365990 Maternal care for other known or suspected poor fetal growth, unspecified trimester, not applicable or unspecified: Secondary | ICD-10-CM

## 2017-09-19 NOTE — Progress Notes (Signed)
Pt informed that the ultrasound is considered a limited OB ultrasound and is not intended to be a complete ultrasound exam.  Patient also informed that the ultrasound is not being completed with the intent of assessing for fetal or placental anomalies or any pelvic abnormalities.  Explained that the purpose of today's ultrasound is to assess for  BPP, presentation and AFI.  Patient acknowledges the purpose of the exam and the limitations of the study.    

## 2017-09-19 NOTE — Patient Instructions (Signed)

## 2017-09-19 NOTE — Progress Notes (Signed)
   PRENATAL VISIT NOTE  Subjective:  Erin Barry is a 22 y.o. G2P0010 at 6219w1d being seen today for ongoing prenatal care.  She is currently monitored for the following issues for this high-risk pregnancy and has Supervision of high risk pregnancy, antepartum and Symmetric IUGR complicating pregnancy, antepartum on their problem list.  Patient reports no complaints.  Contractions: Not present. Vag. Bleeding: None.  Movement: Present. Denies leaking of fluid.   The following portions of the patient's history were reviewed and updated as appropriate: allergies, current medications, past family history, past medical history, past social history, past surgical history and problem list. Problem list updated.  Objective:   Vitals:   09/19/17 1121  BP: 121/75  Pulse: (!) 105  Weight: 196 lb 9.6 oz (89.2 kg)    Fetal Status: Fetal Heart Rate (bpm): NST   Movement: Present     General:  Alert, oriented and cooperative. Patient is in no acute distress.  Skin: Skin is warm and dry. No rash noted.   Cardiovascular: Normal heart rate noted  Respiratory: Normal respiratory effort, no problems with respiration noted  Abdomen: Soft, gravid, appropriate for gestational age.  Pain/Pressure: Present     Pelvic: Cervical exam deferred        Extremities: Normal range of motion.  Edema: None  Mental Status:  Normal mood and affect. Normal behavior. Normal judgment and thought content.   Assessment and Plan:  Pregnancy: G2P0010 at 11019w1d  1. Supervision of high risk pregnancy, antepartum BPP 10/10  2. Symmetric intrauterine growth restriction affecting pregnancy, antepartum, single or unspecified fetus F/u growth US in 2 weeks  Preterm labor symptoms and general obstetric precautions including but not limited to vaginal bleeding, contractions, leaking of fluid and fetal movement were reviewed in detail with the patient. Please refer to After Visit Summary for other counseling recommendations.    Return in about 1 week (around 09/26/2017) for BPP.   Scheryl DarterJames Arnold, MD

## 2017-09-25 ENCOUNTER — Ambulatory Visit (INDEPENDENT_AMBULATORY_CARE_PROVIDER_SITE_OTHER): Payer: Managed Care, Other (non HMO) | Admitting: *Deleted

## 2017-09-25 ENCOUNTER — Ambulatory Visit (INDEPENDENT_AMBULATORY_CARE_PROVIDER_SITE_OTHER): Payer: Managed Care, Other (non HMO) | Admitting: Family Medicine

## 2017-09-25 ENCOUNTER — Encounter (HOSPITAL_COMMUNITY): Payer: Self-pay

## 2017-09-25 ENCOUNTER — Ambulatory Visit (HOSPITAL_COMMUNITY)
Admission: RE | Admit: 2017-09-25 | Discharge: 2017-09-25 | Disposition: A | Discharge status: Home / Self Care | Payer: Medicaid Other | Source: Ambulatory Visit | Attending: Obstetrics & Gynecology | Admitting: Obstetrics & Gynecology

## 2017-09-25 VITALS — BP 128/66 | HR 92 | Wt 199.0 lb

## 2017-09-25 DIAGNOSIS — O365990 Maternal care for other known or suspected poor fetal growth, unspecified trimester, not applicable or unspecified: Secondary | ICD-10-CM

## 2017-09-25 DIAGNOSIS — O0990 Supervision of high risk pregnancy, unspecified, unspecified trimester: Secondary | ICD-10-CM

## 2017-09-25 DIAGNOSIS — O365930 Maternal care for other known or suspected poor fetal growth, third trimester, not applicable or unspecified: Secondary | ICD-10-CM | POA: Diagnosis not present

## 2017-09-25 DIAGNOSIS — Z3689 Encounter for other specified antenatal screening: Secondary | ICD-10-CM | POA: Insufficient documentation

## 2017-09-25 DIAGNOSIS — Z3A35 35 weeks gestation of pregnancy: Secondary | ICD-10-CM | POA: Insufficient documentation

## 2017-09-25 DIAGNOSIS — O0993 Supervision of high risk pregnancy, unspecified, third trimester: Secondary | ICD-10-CM

## 2017-09-25 LAB — POCT URINALYSIS DIP (DEVICE)
BILIRUBIN URINE: NEGATIVE
Glucose, UA: NEGATIVE mg/dL
HGB URINE DIPSTICK: NEGATIVE
KETONES UR: NEGATIVE mg/dL
Leukocytes, UA: NEGATIVE
Nitrite: NEGATIVE
PH: 6.5 (ref 5.0–8.0)
Protein, ur: NEGATIVE mg/dL
Specific Gravity, Urine: 1.015 (ref 1.005–1.030)
Urobilinogen, UA: 0.2 mg/dL (ref 0.0–1.0)

## 2017-09-25 NOTE — Patient Instructions (Signed)
Third Trimester of Pregnancy The third trimester is from week 28 through week 40 (months 7 through 9). The third trimester is a time when the unborn baby (fetus) is growing rapidly. At the end of the ninth month, the fetus is about 20 inches in length and weighs 6-10 pounds. Body changes during your third trimester Your body will continue to go through many changes during pregnancy. The changes vary from woman to woman. During the third trimester:  Your weight will continue to increase. You can expect to gain 25-35 pounds (11-16 kg) by the end of the pregnancy.  You may begin to get stretch marks on your hips, abdomen, and breasts.  You may urinate more often because the fetus is moving lower into your pelvis and pressing on your bladder.  You may develop or continue to have heartburn. This is caused by increased hormones that slow down muscles in the digestive tract.  You may develop or continue to have constipation because increased hormones slow digestion and cause the muscles that push waste through your intestines to relax.  You may develop hemorrhoids. These are swollen veins (varicose veins) in the rectum that can itch or be painful.  You may develop swollen, bulging veins (varicose veins) in your legs.  You may have increased body aches in the pelvis, back, or thighs. This is due to weight gain and increased hormones that are relaxing your joints.  You may have changes in your hair. These can include thickening of your hair, rapid growth, and changes in texture. Some women also have hair loss during or after pregnancy, or hair that feels dry or thin. Your hair will most likely return to normal after your baby is born.  Your breasts will continue to grow and they will continue to become tender. A yellow fluid (colostrum) may leak from your breasts. This is the first milk you are producing for your baby.  Your belly button may stick out.  You may notice more swelling in your hands,  face, or ankles.  You may have increased tingling or numbness in your hands, arms, and legs. The skin on your belly may also feel numb.  You may feel short of breath because of your expanding uterus.  You may have more problems sleeping. This can be caused by the size of your belly, increased need to urinate, and an increase in your body's metabolism.  You may notice the fetus "dropping," or moving lower in your abdomen (lightening).  You may have increased vaginal discharge.  You may notice your joints feel loose and you may have pain around your pelvic bone.  What to expect at prenatal visits You will have prenatal exams every 2 weeks until week 36. Then you will have weekly prenatal exams. During a routine prenatal visit:  You will be weighed to make sure you and the baby are growing normally.  Your blood pressure will be taken.  Your abdomen will be measured to track your baby's growth.  The fetal heartbeat will be listened to.  Any test results from the previous visit will be discussed.  You may have a cervical check near your due date to see if your cervix has softened or thinned (effaced).  You will be tested for Group B streptococcus. This happens between 35 and 37 weeks.  Your health care provider may ask you:  What your birth plan is.  How you are feeling.  If you are feeling the baby move.  If you have had   any abnormal symptoms, such as leaking fluid, bleeding, severe headaches, or abdominal cramping.  If you are using any tobacco products, including cigarettes, chewing tobacco, and electronic cigarettes.  If you have any questions.  Other tests or screenings that may be performed during your third trimester include:  Blood tests that check for low iron levels (anemia).  Fetal testing to check the health, activity level, and growth of the fetus. Testing is done if you have certain medical conditions or if there are problems during the  pregnancy.  Nonstress test (NST). This test checks the health of your baby to make sure there are no signs of problems, such as the baby not getting enough oxygen. During this test, a belt is placed around your belly. The baby is made to move, and its heart rate is monitored during movement.  What is false labor? False labor is a condition in which you feel small, irregular tightenings of the muscles in the womb (contractions) that usually go away with rest, changing position, or drinking water. These are called Braxton Hicks contractions. Contractions may last for hours, days, or even weeks before true labor sets in. If contractions come at regular intervals, become more frequent, increase in intensity, or become painful, you should see your health care provider. What are the signs of labor?  Abdominal cramps.  Regular contractions that start at 10 minutes apart and become stronger and more frequent with time.  Contractions that start on the top of the uterus and spread down to the lower abdomen and back.  Increased pelvic pressure and dull back pain.  A watery or bloody mucus discharge that comes from the vagina.  Leaking of amniotic fluid. This is also known as your "water breaking." It could be a slow trickle or a gush. Let your health care provider know if it has a color or strange odor. If you have any of these signs, call your health care provider right away, even if it is before your due date. Follow these instructions at home: Medicines  Follow your health care provider's instructions regarding medicine use. Specific medicines may be either safe or unsafe to take during pregnancy.  Take a prenatal vitamin that contains at least 600 micrograms (mcg) of folic acid.  If you develop constipation, try taking a stool softener if your health care provider approves. Eating and drinking  Eat a balanced diet that includes fresh fruits and vegetables, whole grains, good sources of protein  such as meat, eggs, or tofu, and low-fat dairy. Your health care provider will help you determine the amount of weight gain that is right for you.  Avoid raw meat and uncooked cheese. These carry germs that can cause birth defects in the baby.  If you have low calcium intake from food, talk to your health care provider about whether you should take a daily calcium supplement.  Eat four or five small meals rather than three large meals a day.  Limit foods that are high in fat and processed sugars, such as fried and sweet foods.  To prevent constipation: ? Drink enough fluid to keep your urine clear or pale yellow. ? Eat foods that are high in fiber, such as fresh fruits and vegetables, whole grains, and beans. Activity  Exercise only as directed by your health care provider. Most women can continue their usual exercise routine during pregnancy. Try to exercise for 30 minutes at least 5 days a week. Stop exercising if you experience uterine contractions.  Avoid heavy   lifting.  Do not exercise in extreme heat or humidity, or at high altitudes.  Wear low-heel, comfortable shoes.  Practice good posture.  You may continue to have sex unless your health care provider tells you otherwise. Relieving pain and discomfort  Take frequent breaks and rest with your legs elevated if you have leg cramps or low back pain.  Take warm sitz baths to soothe any pain or discomfort caused by hemorrhoids. Use hemorrhoid cream if your health care provider approves.  Wear a good support bra to prevent discomfort from breast tenderness.  If you develop varicose veins: ? Wear support pantyhose or compression stockings as told by your healthcare provider. ? Elevate your feet for 15 minutes, 3-4 times a day. Prenatal care  Write down your questions. Take them to your prenatal visits.  Keep all your prenatal visits as told by your health care provider. This is important. Safety  Wear your seat belt at  all times when driving.  Make a list of emergency phone numbers, including numbers for family, friends, the hospital, and police and fire departments. General instructions  Avoid cat litter boxes and soil used by cats. These carry germs that can cause birth defects in the baby. If you have a cat, ask someone to clean the litter box for you.  Do not travel far distances unless it is absolutely necessary and only with the approval of your health care provider.  Do not use hot tubs, steam rooms, or saunas.  Do not drink alcohol.  Do not use any products that contain nicotine or tobacco, such as cigarettes and e-cigarettes. If you need help quitting, ask your health care provider.  Do not use any medicinal herbs or unprescribed drugs. These chemicals affect the formation and growth of the baby.  Do not douche or use tampons or scented sanitary pads.  Do not cross your legs for long periods of time.  To prepare for the arrival of your baby: ? Take prenatal classes to understand, practice, and ask questions about labor and delivery. ? Make a trial run to the hospital. ? Visit the hospital and tour the maternity area. ? Arrange for maternity or paternity leave through employers. ? Arrange for family and friends to take care of pets while you are in the hospital. ? Purchase a rear-facing car seat and make sure you know how to install it in your car. ? Pack your hospital bag. ? Prepare the baby's nursery. Make sure to remove all pillows and stuffed animals from the baby's crib to prevent suffocation.  Visit your dentist if you have not gone during your pregnancy. Use a soft toothbrush to brush your teeth and be gentle when you floss. Contact a health care provider if:  You are unsure if you are in labor or if your water has broken.  You become dizzy.  You have mild pelvic cramps, pelvic pressure, or nagging pain in your abdominal area.  You have lower back pain.  You have persistent  nausea, vomiting, or diarrhea.  You have an unusual or bad smelling vaginal discharge.  You have pain when you urinate. Get help right away if:  Your water breaks before 37 weeks.  You have regular contractions less than 5 minutes apart before 37 weeks.  You have a fever.  You are leaking fluid from your vagina.  You have spotting or bleeding from your vagina.  You have severe abdominal pain or cramping.  You have rapid weight loss or weight gain.    You have shortness of breath with chest pain.  You notice sudden or extreme swelling of your face, hands, ankles, feet, or legs.  Your baby makes fewer than 10 movements in 2 hours.  You have severe headaches that do not go away when you take medicine.  You have vision changes. Summary  The third trimester is from week 28 through week 40, months 7 through 9. The third trimester is a time when the unborn baby (fetus) is growing rapidly.  During the third trimester, your discomfort may increase as you and your baby continue to gain weight. You may have abdominal, leg, and back pain, sleeping problems, and an increased need to urinate.  During the third trimester your breasts will keep growing and they will continue to become tender. A yellow fluid (colostrum) may leak from your breasts. This is the first milk you are producing for your baby.  False labor is a condition in which you feel small, irregular tightenings of the muscles in the womb (contractions) that eventually go away. These are called Braxton Hicks contractions. Contractions may last for hours, days, or even weeks before true labor sets in.  Signs of labor can include: abdominal cramps; regular contractions that start at 10 minutes apart and become stronger and more frequent with time; watery or bloody mucus discharge that comes from the vagina; increased pelvic pressure and dull back pain; and leaking of amniotic fluid. This information is not intended to replace advice  given to you by your health care provider. Make sure you discuss any questions you have with your health care provider. Document Released: 06/26/2001 Document Revised: 12/08/2015 Document Reviewed: 09/02/2012 Elsevier Interactive Patient Education  2017 [employer]  

## 2017-09-25 NOTE — Progress Notes (Signed)
   PRENATAL VISIT NOTE  Subjective:  Erin Barry is a 22 y.o. G2P0010 at 3081w0d being seen today for ongoing prenatal care.  She is currently monitored for the following issues for this high-risk pregnancy and has Supervision of high risk pregnancy, antepartum and Symmetric IUGR complicating pregnancy, antepartum on their problem list.  Patient reports no complaints.  Contractions: Not present. Vag. Bleeding: None.  Movement: Present. Denies leaking of fluid.   The following portions of the patient's history were reviewed and updated as appropriate: allergies, current medications, past family history, past medical history, past social history, past surgical history and problem list. Problem list updated.  Objective:   Vitals:   09/25/17 1316  BP: 128/66  Pulse: 92  Weight: 199 lb (90.3 kg)    Fetal Status: Fetal Heart Rate (bpm): NST   Movement: Present     General:  Alert, oriented and cooperative. Patient is in no acute distress.  Skin: Skin is warm and dry. No rash noted.   Cardiovascular: Normal heart rate noted  Respiratory: Normal respiratory effort, no problems with respiration noted  Abdomen: Soft, gravid, appropriate for gestational age.  Pain/Pressure: Present     Pelvic: Cervical exam deferred        Extremities: Normal range of motion.  Edema: None  Mental Status:  Normal mood and affect. Normal behavior. Normal judgment and thought content.  NST:  Baseline: 145 bpm, Variability: Good {> 6 bpm), Accelerations: Reactive and Decelerations: Absent   Assessment and Plan:  Pregnancy: G2P0010 at 6081w0d  1. Symmetric intrauterine growth restriction affecting pregnancy, antepartum, single or unspecified fetus Reassuring fetal testing today For u/s for growth this pm.  2. Supervision of high risk pregnancy, antepartum     Preterm labor symptoms and general obstetric precautions including but not limited to vaginal bleeding, contractions, leaking of fluid and fetal  movement were reviewed in detail with the patient. Please refer to After Visit Summary for other counseling recommendations.  Return in 1 week (on 10/02/2017).   Reva Boresanya S Jnyah Brazee, MD

## 2017-10-02 ENCOUNTER — Ambulatory Visit: Payer: Self-pay

## 2017-10-02 ENCOUNTER — Other Ambulatory Visit (HOSPITAL_COMMUNITY)
Admission: RE | Admit: 2017-10-02 | Discharge: 2017-10-02 | Disposition: A | Discharge status: Home / Self Care | Payer: Medicaid Other | Source: Ambulatory Visit | Attending: Obstetrics & Gynecology | Admitting: Obstetrics & Gynecology

## 2017-10-02 ENCOUNTER — Ambulatory Visit (INDEPENDENT_AMBULATORY_CARE_PROVIDER_SITE_OTHER): Payer: Medicaid Other | Admitting: General Practice

## 2017-10-02 ENCOUNTER — Ambulatory Visit (INDEPENDENT_AMBULATORY_CARE_PROVIDER_SITE_OTHER): Payer: Managed Care, Other (non HMO) | Admitting: Obstetrics & Gynecology

## 2017-10-02 VITALS — BP 120/80 | Wt 197.0 lb

## 2017-10-02 DIAGNOSIS — O365990 Maternal care for other known or suspected poor fetal growth, unspecified trimester, not applicable or unspecified: Secondary | ICD-10-CM

## 2017-10-02 DIAGNOSIS — Z3A36 36 weeks gestation of pregnancy: Secondary | ICD-10-CM | POA: Insufficient documentation

## 2017-10-02 DIAGNOSIS — O0990 Supervision of high risk pregnancy, unspecified, unspecified trimester: Secondary | ICD-10-CM

## 2017-10-02 DIAGNOSIS — O0993 Supervision of high risk pregnancy, unspecified, third trimester: Secondary | ICD-10-CM | POA: Diagnosis not present

## 2017-10-02 LAB — OB RESULTS CONSOLE GBS
GBS: NEGATIVE
STREP GROUP B AG: NEGATIVE

## 2017-10-02 NOTE — Patient Instructions (Signed)

## 2017-10-02 NOTE — Progress Notes (Signed)
   PRENATAL VISIT NOTE  Subjective:  Erin Barry is a 22 y.o. G2P0010 at 6446w0d being seen today for ongoing prenatal care.  She is currently monitored for the following issues for this high-risk pregnancy and has Supervision of high risk pregnancy, antepartum and Symmetric IUGR complicating pregnancy, antepartum on their problem list.  Patient reports no complaints.  Contractions: Not present. Vag. Bleeding: None.  Movement: Present. Denies leaking of fluid.   The following portions of the patient's history were reviewed and updated as appropriate: allergies, current medications, past family history, past medical history, past social history, past surgical history and problem list. Problem list updated.  Objective:   Vitals:   10/02/17 1325  BP: 120/80  Weight: 197 lb (89.4 kg)    Fetal Status: Fetal Heart Rate (bpm): NST   Movement: Present     General:  Alert, oriented and cooperative. Patient is in no acute distress.  Skin: Skin is warm and dry. No rash noted.   Cardiovascular: Normal heart rate noted  Respiratory: Normal respiratory effort, no problems with respiration noted  Abdomen: Soft, gravid, appropriate for gestational age.  Pain/Pressure: Absent     Pelvic: Cervical exam performed        Extremities: Normal range of motion.  Edema: None  Mental Status:  Normal mood and affect. Normal behavior. Normal judgment and thought content.   Assessment and Plan:  Pregnancy: G2P0010 at 9546w0d  1. Supervision of high risk pregnancy, antepartum Routine testing - Culture, beta strep (group b only) - GC[facility]Chlamydia probe [facility]  2. Symmetric intrauterine growth restriction affecting pregnancy, antepartum, single or unspecified fetus 09/25/17 US Single living intrauterine pregnancy at 35 weeks 0 days.  Appropriate interval fetal growth (EFW 20%, AC 23%).  Normal amniotic fluid volume.  Normal interval fetal anatomy.  BPP  8/8. ---------------------------------------------------------------------- Recommendations  Follow-up ultrasounds as clinically indicated. ----------------------------------------------------------------------                 Rema FendtJoshua Nitsche, MD Electronically Signed Final Report   09/25/2017 03:46 pm NST reactive and BPP 10/10 today Preterm labor symptoms and general obstetric precautions including but not limited to vaginal bleeding, contractions, leaking of fluid and fetal movement were reviewed in detail with the patient. Please refer to After Visit Summary for other counseling recommendations.  Return in about 1 week (around 10/09/2017). Routine prenatal care  Scheryl DarterJames Kodee Drury, MD

## 2017-10-02 NOTE — Progress Notes (Signed)
Pt informed that the ultrasound is considered a limited OB ultrasound and is not intended to be a complete ultrasound exam.  Patient also informed that the ultrasound is not being completed with the intent of assessing for fetal or placental anomalies or any pelvic abnormalities.  Explained that the purpose of today's ultrasound is to assess for  BPP, presentation and AFI.  Patient acknowledges the purpose of the exam and the limitations of the study.    

## 2017-10-03 LAB — GC/CHLAMYDIA PROBE AMP (~~LOC~~) NOT AT ARMC
Chlamydia: NEGATIVE
Neisseria Gonorrhea: NEGATIVE

## 2017-10-06 LAB — CULTURE, BETA STREP (GROUP B ONLY): STREP GP B CULTURE: NEGATIVE

## 2017-10-09 ENCOUNTER — Other Ambulatory Visit: Payer: Managed Care, Other (non HMO)

## 2017-10-09 ENCOUNTER — Ambulatory Visit (INDEPENDENT_AMBULATORY_CARE_PROVIDER_SITE_OTHER): Payer: Managed Care, Other (non HMO) | Admitting: Obstetrics & Gynecology

## 2017-10-09 VITALS — BP 117/75 | HR 85 | Wt 199.3 lb

## 2017-10-09 DIAGNOSIS — O365990 Maternal care for other known or suspected poor fetal growth, unspecified trimester, not applicable or unspecified: Secondary | ICD-10-CM

## 2017-10-09 NOTE — Progress Notes (Signed)
   PRENATAL VISIT NOTE  Subjective:  Erin Barry is a 22 y.o. G2P0010 at 435w0d being seen today for ongoing prenatal care.  She is currently monitored for the following issues for this high-risk pregnancy and has Supervision of high risk pregnancy, antepartum on their problem list.  Patient reports no complaints.   .  .   . Denies leaking of fluid.   The following portions of the patient's history were reviewed and updated as appropriate: allergies, current medications, past family history, past medical history, past social history, past surgical history and problem list. Problem list updated.  Objective:  There were no vitals filed for this visit.  Fetal Status:           General:  Alert, oriented and cooperative. Patient is in no acute distress.  Skin: Skin is warm and dry. No rash noted.   Cardiovascular: Normal heart rate noted  Respiratory: Normal respiratory effort, no problems with respiration noted  Abdomen: Soft, gravid, appropriate for gestational age.        Pelvic: Cervical exam performed        Extremities: Normal range of motion.     Mental Status:  Normal mood and affect. Normal behavior. Normal judgment and thought content.   Assessment and Plan:  Pregnancy: G2P0010 at 1135w0d.  Is being followed for IUGR; last growth US is just above normal.  Last BPP 10/10.  I elect to continue testing for one more week and induce at 39 weeks.  Pt also wants to continue testing as she has had multiple plans and concerned that IUGR still exists.  Pt can only stay for NST today.  Will need BPP next week.  Fetal kick counts.  RN Zachery DauerBarnes to schedule induction  NST reactive today.     Term labor symptoms and general obstetric precautions including but not limited to vaginal bleeding, contractions, leaking of fluid and fetal movement were reviewed in detail with the patient. Please refer to After Visit Summary for other counseling recommendations.  Return in about 1 week (around  10/16/2017).   Elsie LincolnKelly Josephine Rudnick, MD

## 2017-10-09 NOTE — Addendum Note (Signed)
Addended by: Kathee DeltonHILLMAN, CARRIE L on: 10/09/2017 04:49 PM   Modules accepted: Orders

## 2017-10-10 ENCOUNTER — Telehealth (HOSPITAL_COMMUNITY): Payer: Self-pay | Admitting: *Deleted

## 2017-10-10 NOTE — Telephone Encounter (Signed)
Preadmission screen  

## 2017-10-14 ENCOUNTER — Encounter: Payer: Self-pay | Admitting: *Deleted

## 2017-10-16 ENCOUNTER — Encounter: Payer: Managed Care, Other (non HMO) | Admitting: Obstetrics and Gynecology

## 2017-10-16 ENCOUNTER — Other Ambulatory Visit: Payer: Managed Care, Other (non HMO)

## 2017-10-17 ENCOUNTER — Ambulatory Visit: Payer: Medicaid Other | Admitting: *Deleted

## 2017-10-17 ENCOUNTER — Ambulatory Visit: Payer: Self-pay

## 2017-10-17 ENCOUNTER — Ambulatory Visit (INDEPENDENT_AMBULATORY_CARE_PROVIDER_SITE_OTHER): Payer: Managed Care, Other (non HMO) | Admitting: Obstetrics & Gynecology

## 2017-10-17 VITALS — BP 124/72 | HR 86 | Wt 200.6 lb

## 2017-10-17 DIAGNOSIS — O365990 Maternal care for other known or suspected poor fetal growth, unspecified trimester, not applicable or unspecified: Secondary | ICD-10-CM

## 2017-10-17 DIAGNOSIS — O0990 Supervision of high risk pregnancy, unspecified, unspecified trimester: Secondary | ICD-10-CM | POA: Diagnosis not present

## 2017-10-17 NOTE — Progress Notes (Signed)
IOL scheduled 4/10.

## 2017-10-17 NOTE — Progress Notes (Signed)
   PRENATAL VISIT NOTE  Subjective:  Erin Barry is a 22 y.o. G2P0010 at 1856w1d being seen today for ongoing prenatal care.  She is currently monitored for the following issues for this high-risk pregnancy and has Supervision of high risk pregnancy, antepartum on their problem list.  Patient reports no complaints.  Contractions: Irregular. Vag. Bleeding: None.  Movement: Present. Denies leaking of fluid.   The following portions of the patient's history were reviewed and updated as appropriate: allergies, current medications, past family history, past medical history, past social history, past surgical history and problem list. Problem list updated.  Objective:   Vitals:   10/17/17 0950  BP: 124/72  Pulse: 86  Weight: 200 lb 9.6 oz (91 kg)    Fetal Status: Fetal Heart Rate (bpm): NST   Movement: Present     General:  Alert, oriented and cooperative. Patient is in no acute distress.  Skin: Skin is warm and dry. No rash noted.   Cardiovascular: Normal heart rate noted  Respiratory: Normal respiratory effort, no problems with respiration noted  Abdomen: Soft, gravid, appropriate for gestational age.  Pain/Pressure: Present     Pelvic: Cervical exam deferred        Extremities: Normal range of motion.  Edema: None  Mental Status: Normal mood and affect. Normal behavior. Normal judgment and thought content.   Assessment and Plan:  Pregnancy: G2P0010 at 5356w1d  1. Supervision of high risk pregnancy, antepartum Good fetal surveillance  2. Poor fetal growth affecting management of mother, antepartum, single or unspecified fetus IOL 4/10  Term labor symptoms and general obstetric precautions including but not limited to vaginal bleeding, contractions, leaking of fluid and fetal movement were reviewed in detail with the patient. Please refer to After Visit Summary for other counseling recommendations.  Return in about 5 weeks (around 11/21/2017) for PP visit.  IOL on 4/10..  Future  Appointments  Date Time Provider Department Center  10/23/2017  6:30 AM [facility] None    Scheryl DarterJames Arnold, MD

## 2017-10-17 NOTE — Patient Instructions (Signed)
Labor Induction Labor induction is when steps are taken to cause a pregnant woman to begin the labor process. Most women go into labor on their own between 37 weeks and 42 weeks of the pregnancy. When this does not happen or when there is a medical need, methods may be used to induce labor. Labor induction causes a pregnant woman's uterus to contract. It also causes the cervix to soften (ripen), open (dilate), and thin out (efface). Usually, labor is not induced before 39 weeks of the pregnancy unless there is a problem with the baby or mother. Before inducing labor, your health care provider will consider a number of factors, including the following:  The medical condition of you and the baby.  How many weeks along you are.  The status of the baby's lung maturity.  The condition of the cervix.  The position of the baby. What are the reasons for labor induction? Labor may be induced for the following reasons:  The health of the baby or mother is at risk.  The pregnancy is overdue by 1 week or more.  The water breaks but labor does not start on its own.  The mother has a health condition or serious illness, such as high blood pressure, infection, placental abruption, or diabetes.  The amniotic fluid amounts are low around the baby.  The baby is distressed. Convenience or wanting the baby to be born on a certain date is not a reason for inducing labor. What methods are used for labor induction? Several methods of labor induction may be used, such as:  Prostaglandin medicine. This medicine causes the cervix to dilate and ripen. The medicine will also start contractions. It can be taken by mouth or by inserting a suppository into the vagina.  Inserting a thin tube (catheter) with a balloon on the end into the vagina to dilate the cervix. Once inserted, the balloon is expanded with water, which causes the cervix to open.  Stripping the membranes. Your health care provider separates  amniotic sac tissue from the cervix, causing the cervix to be stretched and causing the release of a hormone called progesterone. This may cause the uterus to contract. It is often done during an office visit. You will be sent home to wait for the contractions to begin. You will then come in for an induction.  Breaking the water. Your health care provider makes a hole in the amniotic sac using a small instrument. Once the amniotic sac breaks, contractions should begin. This may still take hours to see an effect.  Medicine to trigger or strengthen contractions. This medicine is given through an IV access tube inserted into a vein in your arm. All of the methods of induction, besides stripping the membranes, will be done in the hospital. Induction is done in the hospital so that you and the baby can be carefully monitored. How long does it take for labor to be induced? Some inductions can take up to 2-3 days. Depending on the cervix, it usually takes less time. It takes longer when you are induced early in the pregnancy or if this is your first pregnancy. If a mother is still pregnant and the induction has been going on for 2-3 days, either the mother will be sent home or a cesarean delivery will be needed. What are the risks associated with labor induction? Some of the risks of induction include:  Changes in fetal heart rate, such as too high, too low, or erratic.  Fetal distress.    Chance of infection for the mother and baby.  Increased chance of having a cesarean delivery.  Breaking off (abruption) of the placenta from the uterus (rare).  Uterine rupture (very rare). When induction is needed for medical reasons, the benefits of induction may outweigh the risks. What are some reasons for not inducing labor? Labor induction should not be done if:  It is shown that your baby does not tolerate labor.  You have had previous surgeries on your uterus, such as a myomectomy or the removal of  fibroids.  Your placenta lies very low in the uterus and blocks the opening of the cervix (placenta previa).  Your baby is not in a head-down position.  The umbilical cord drops down into the birth canal in front of the baby. This could cut off the baby's blood and oxygen supply.  You have had a previous cesarean delivery.  There are unusual circumstances, such as the baby being extremely premature. This information is not intended to replace advice given to you by your health care provider. Make sure you discuss any questions you have with your health care provider. Document Released: 11/21/2006 Document Revised: 12/08/2015 Document Reviewed: 01/29/2013 Elsevier Interactive Patient Education  2017 Elsevier Inc.  

## 2017-10-17 NOTE — Progress Notes (Signed)

## 2017-10-23 ENCOUNTER — Other Ambulatory Visit: Payer: Managed Care, Other (non HMO)

## 2017-10-23 ENCOUNTER — Inpatient Hospital Stay (HOSPITAL_COMMUNITY): Payer: Managed Care, Other (non HMO) | Admitting: Anesthesiology

## 2017-10-23 ENCOUNTER — Encounter: Payer: Managed Care, Other (non HMO) | Admitting: Obstetrics and Gynecology

## 2017-10-23 ENCOUNTER — Inpatient Hospital Stay (HOSPITAL_COMMUNITY)
Admission: RE | Admit: 2017-10-23 | Discharge: 2017-10-26 | DRG: 806 | Disposition: A | Discharge status: Home / Self Care | Payer: Managed Care, Other (non HMO) | Source: Ambulatory Visit | Attending: Obstetrics and Gynecology | Admitting: Obstetrics and Gynecology

## 2017-10-23 ENCOUNTER — Encounter (HOSPITAL_COMMUNITY): Payer: Self-pay

## 2017-10-23 ENCOUNTER — Other Ambulatory Visit: Payer: Self-pay

## 2017-10-23 DIAGNOSIS — D649 Anemia, unspecified: Secondary | ICD-10-CM | POA: Diagnosis present

## 2017-10-23 DIAGNOSIS — O9902 Anemia complicating childbirth: Secondary | ICD-10-CM | POA: Diagnosis present

## 2017-10-23 DIAGNOSIS — O0990 Supervision of high risk pregnancy, unspecified, unspecified trimester: Secondary | ICD-10-CM

## 2017-10-23 DIAGNOSIS — O365990 Maternal care for other known or suspected poor fetal growth, unspecified trimester, not applicable or unspecified: Secondary | ICD-10-CM | POA: Diagnosis present

## 2017-10-23 DIAGNOSIS — O365930 Maternal care for other known or suspected poor fetal growth, third trimester, not applicable or unspecified: Secondary | ICD-10-CM | POA: Diagnosis present

## 2017-10-23 DIAGNOSIS — Z3A39 39 weeks gestation of pregnancy: Secondary | ICD-10-CM | POA: Diagnosis not present

## 2017-10-23 LAB — CBC
HCT: 31.1 % — ABNORMAL LOW (ref 36.0–46.0)
Hemoglobin: 10.6 g/dL — ABNORMAL LOW (ref 12.0–15.0)
MCH: 29 pg (ref 26.0–34.0)
MCHC: 34.1 g/dL (ref 30.0–36.0)
MCV: 85 fL (ref 78.0–100.0)
Platelets: 190 10*3/uL (ref 150–400)
RBC: 3.66 MIL/uL — ABNORMAL LOW (ref 3.87–5.11)
RDW: 13.8 % (ref 11.5–15.5)
WBC: 6.6 10*3/uL (ref 4.0–10.5)

## 2017-10-23 LAB — RPR: RPR Ser Ql: NONREACTIVE

## 2017-10-23 MED ORDER — FENTANYL 2.5 MCG/ML BUPIVACAINE 1/10 % EPIDURAL INFUSION (WH - ANES)
14.0000 mL/h | INTRAMUSCULAR | Status: DC | PRN
  Administered 2017-10-23: 12 mL/h via EPIDURAL
  Administered 2017-10-24: 14 mL/h via EPIDURAL
  Filled 2017-10-23 (×2): qty 100

## 2017-10-23 MED ORDER — LIDOCAINE HCL (PF) 1 % IJ SOLN
30.0000 mL | INTRAMUSCULAR | Status: DC | PRN
  Filled 2017-10-23: qty 30

## 2017-10-23 MED ORDER — LIDOCAINE HCL (PF) 1 % IJ SOLN
INTRAMUSCULAR | Status: DC | PRN
  Administered 2017-10-23: 5 mL via EPIDURAL
  Administered 2017-10-23: 3 mL via EPIDURAL
  Administered 2017-10-23: 2 mL via EPIDURAL

## 2017-10-23 MED ORDER — MISOPROSTOL 50MCG HALF TABLET
50.0000 ug | ORAL_TABLET | ORAL | Status: DC | PRN
  Administered 2017-10-23 (×2): 50 ug via ORAL
  Filled 2017-10-23 (×4): qty 1

## 2017-10-23 MED ORDER — EPHEDRINE 5 MG/ML INJ
10.0000 mg | INTRAVENOUS | Status: DC | PRN
  Filled 2017-10-23: qty 2

## 2017-10-23 MED ORDER — TERBUTALINE SULFATE 1 MG/ML IJ SOLN
0.2500 mg | Freq: Once | INTRAMUSCULAR | Status: DC | PRN
  Filled 2017-10-23: qty 1

## 2017-10-23 MED ORDER — LACTATED RINGERS IV SOLN
500.0000 mL | Freq: Once | INTRAVENOUS | Status: AC
  Administered 2017-10-24: 500 mL via INTRAVENOUS

## 2017-10-23 MED ORDER — FENTANYL CITRATE (PF) 100 MCG/2ML IJ SOLN
100.0000 ug | INTRAMUSCULAR | Status: DC | PRN
  Administered 2017-10-23 (×2): 100 ug via INTRAVENOUS
  Filled 2017-10-23 (×2): qty 2

## 2017-10-23 MED ORDER — OXYCODONE-ACETAMINOPHEN 5-325 MG PO TABS: 2.0000 | ORAL_TABLET | ORAL | Status: DC | PRN

## 2017-10-23 MED ORDER — DIPHENHYDRAMINE HCL 50 MG/ML IJ SOLN: 12.5000 mg | INTRAMUSCULAR | Status: DC | PRN

## 2017-10-23 MED ORDER — OXYTOCIN BOLUS FROM INFUSION
500.0000 mL | Freq: Once | INTRAVENOUS | Status: AC
  Administered 2017-10-24: 500 mL via INTRAVENOUS

## 2017-10-23 MED ORDER — ONDANSETRON HCL 4 MG/2ML IJ SOLN
4.0000 mg | Freq: Four times a day (QID) | INTRAMUSCULAR | Status: DC | PRN
  Administered 2017-10-24 (×2): 4 mg via INTRAVENOUS
  Filled 2017-10-23 (×2): qty 2

## 2017-10-23 MED ORDER — PHENYLEPHRINE 40 MCG/ML (10ML) SYRINGE FOR IV PUSH (FOR BLOOD PRESSURE SUPPORT)
80.0000 ug | PREFILLED_SYRINGE | INTRAVENOUS | Status: DC | PRN
  Administered 2017-10-24: 80 ug via INTRAVENOUS
  Filled 2017-10-23: qty 5

## 2017-10-23 MED ORDER — MISOPROSTOL 25 MCG QUARTER TABLET: 25.0000 ug | ORAL_TABLET | ORAL | Status: DC | PRN

## 2017-10-23 MED ORDER — OXYTOCIN 40 UNITS IN LACTATED RINGERS INFUSION - SIMPLE MED
2.5000 [IU]/h | INTRAVENOUS | Status: DC
  Filled 2017-10-23 (×2): qty 1000

## 2017-10-23 MED ORDER — LACTATED RINGERS IV SOLN: 500.0000 mL | Freq: Once | INTRAVENOUS | Status: DC

## 2017-10-23 MED ORDER — OXYTOCIN 40 UNITS IN LACTATED RINGERS INFUSION - SIMPLE MED
1.0000 m[IU]/min | INTRAVENOUS | Status: DC
  Administered 2017-10-23: 2 m[IU]/min via INTRAVENOUS
  Administered 2017-10-23: 4 m[IU]/min via INTRAVENOUS

## 2017-10-23 MED ORDER — LACTATED RINGERS IV SOLN
500.0000 mL | INTRAVENOUS | Status: DC | PRN
  Administered 2017-10-24: 500 mL via INTRAVENOUS

## 2017-10-23 MED ORDER — PHENYLEPHRINE 40 MCG/ML (10ML) SYRINGE FOR IV PUSH (FOR BLOOD PRESSURE SUPPORT)
80.0000 ug | PREFILLED_SYRINGE | INTRAVENOUS | Status: DC | PRN
  Filled 2017-10-23: qty 10
  Filled 2017-10-23: qty 5

## 2017-10-23 MED ORDER — ACETAMINOPHEN 325 MG PO TABS: 650.0000 mg | ORAL_TABLET | ORAL | Status: DC | PRN

## 2017-10-23 MED ORDER — OXYCODONE-ACETAMINOPHEN 5-325 MG PO TABS: 1.0000 | ORAL_TABLET | ORAL | Status: DC | PRN

## 2017-10-23 MED ORDER — SOD CITRATE-CITRIC ACID 500-334 MG/5ML PO SOLN: 30.0000 mL | ORAL | Status: DC | PRN

## 2017-10-23 MED ORDER — LACTATED RINGERS IV SOLN
INTRAVENOUS | Status: DC
  Administered 2017-10-23 – 2017-10-24 (×6): via INTRAVENOUS

## 2017-10-23 NOTE — Progress Notes (Signed)
LABOR PROGRESS NOTE  Kathline Magicmone Busby is a 22 y.o. G2P0010 at 7320w0d  admitted for IOL for poor fetal growth  Subjective: Patient doing well. She reports pain is well controlled with epidural. Denies any concerns.  Objective: BP 126/75   Pulse 81   Temp 98.4 F (36.9 C) (Oral)   Resp 18   Ht 5\' 3"  (1.6 m)   Wt 202 lb 9.6 oz (91.9 kg)   LMP 01/23/2017 (Exact Date)   SpO2 100%   BMI 35.89 kg/m  or  Vitals:   10/23/17 2055 10/23/17 2056 10/23/17 2100 10/23/17 2101  BP:  128/82  126/75  Pulse:  91  81  Resp:  18    Temp:      TempSrc:      SpO2: 100%  100%   Weight:      Height:        SVE: Dilation: 4.5 Effacement (%): 60 Cervical Position: Posterior Station: -2 Presentation: Vertex Exam by:: katherine g jones RN  FHT: baseline rate 140, moderate varibility, + acel, no decel Toco: ctx q 1-4 min  Assessment / Plan: 22 y.o. G2P0010 at 5720w0d here for poor fetal growth  Labor: Latent. Continue to titrate IV Pitocin Fetal Wellbeing:  Cat I Pain Control:  Well-controlled with epidural Anticipated MOD:  SVD  Frederik PearJulie P Callaway Hailes, MD 10/23/2017, 9:21 PM

## 2017-10-23 NOTE — Anesthesia Preprocedure Evaluation (Signed)
Anesthesia Evaluation  Patient identified by MRN, date of birth, ID band Patient awake    Reviewed: Allergy & Precautions, NPO status , Patient's Chart, lab work & pertinent test results  Airway Mallampati: II  TM Distance: >3 FB Neck ROM: Full    Dental  (+) Teeth Intact, Dental Advisory Given   Pulmonary neg pulmonary ROS,    Pulmonary exam normal breath sounds clear to auscultation       Cardiovascular negative cardio ROS Normal cardiovascular exam Rhythm:Regular Rate:Normal     Neuro/Psych  Headaches, H/o Pseudotumor cerebri: resolved per patient. Denies HA, neck stiffness, blurred vision. negative psych ROS   GI/Hepatic negative GI ROS, Neg liver ROS,   Endo/Other  negative endocrine ROSObesity   Renal/GU negative Renal ROS     Musculoskeletal negative musculoskeletal ROS (+)   Abdominal   Peds  Hematology  (+) Blood dyscrasia, anemia , Plt 190k   Anesthesia Other Findings Day of surgery medications reviewed with the patient.  Reproductive/Obstetrics (+) Pregnancy                             Anesthesia Physical Anesthesia Plan  ASA: II  Anesthesia Plan: Epidural   Post-op Pain Management:    Induction:   PONV Risk Score and Plan: 2 and Treatment may vary due to age or medical condition  Airway Management Planned:   Additional Equipment:   Intra-op Plan:   Post-operative Plan:   Informed Consent: I have reviewed the patients History and Physical, chart, labs and discussed the procedure including the risks, benefits and alternatives for the proposed anesthesia with the patient or authorized representative who has indicated his/her understanding and acceptance.   Dental advisory given  Plan Discussed with:   Anesthesia Plan Comments: (Patient identified. Risks/Benefits/Options discussed with patient including but not limited to bleeding, infection, nerve damage,  paralysis, failed block, incomplete pain control, headache, blood pressure changes, nausea, vomiting, reactions to medication both or allergic, itching and postpartum back pain. Confirmed with bedside nurse the patient's most recent platelet count. Confirmed with patient that they are not currently taking any anticoagulation, have any bleeding history or any family history of bleeding disorders. Patient expressed understanding and wished to proceed. All questions were answered. )        Anesthesia Quick Evaluation

## 2017-10-23 NOTE — Anesthesia Pain Management Evaluation Note (Signed)
  CRNA Pain Management Visit Note  Patient: Erin Barry, 22 y.o., female  "Hello I am a [facility] of the anesthesia [facility] at [facility]. We have an anesthesia team available at all times to provide care throughout the hospital, including epidural management and anesthesia for C-section. I don't know your plan for the delivery whether it a natural birth, water birth, IV sedation, nitrous supplementation, doula or epidural, but we want to meet your pain goals."   1.Was your pain managed to your expectations on prior hospitalizations?   No prior hospitalizations  2.What is your expectation for pain management during this hospitalization?     Labor support without medications, Epidural, IV pain meds and Nitrous Oxide  3.How can we help you reach that goal? Pt open to discussion about all methods of pain control. Questions answered.  Record the patient's initial score and the patient's pain goal.   Pain: 0  Pain Goal: 5 [facility] wants you to be able to say your pain was always managed very well.  Erin Barry 10/23/2017

## 2017-10-23 NOTE — Progress Notes (Signed)
Subjective: Erin Barry is a 22 y.o. G2P0010 at 6336w0d gestation admitted for induction of labor due to Poor fetal growth.  Objective: BP 131/66   Pulse 82   Temp 98.5 F (36.9 C) (Oral)   Resp 20   Ht 5\' 3"  (1.6 m)   Wt 202 lb 9.6 oz (91.9 kg)   LMP 01/23/2017 (Exact Date)   BMI 35.89 kg/m   FHT:  FHR: 140 bpm, variability: moderate,  accelerations:  Present,  decelerations:  Absent UC:   irregular, every 2-7 minutes SVE:   Dilation: 1 Effacement (%): 60 Station: -3 Exam by:: Raelyn Moraolitta Vashti Bolanos CNM Cervical Balloon placed without difficulty, 60 ml of LR instilled in balloon by RN; patient tolerated procedure well  Labs: Lab Results  Component Value Date   WBC 6.6 10/23/2017   HGB 10.6 (L) 10/23/2017   HCT 31.1 (L) 10/23/2017   MCV 85.0 10/23/2017   PLT 190 10/23/2017    Assessment / Plan: Induction of labor due to poor fetal growth,  progressing well on pitocin  Labor: Progressing normally Preeclampsia:  n/a Fetal Wellbeing:  Category I Pain Control:  Labor support without medications I/D:  n/a Anticipated MOD:  NSVD  Raelyn Moraolitta Jye Fariss, MSN, CNM 10/23/2017, 2:01 PM

## 2017-10-23 NOTE — Anesthesia Procedure Notes (Signed)
Epidural Patient location during procedure: OB Start time: 10/23/2017 8:23 PM End time: 10/23/2017 8:30 PM  Staffing Anesthesiologist: Cecile Hearingurk, Yasmin Dibello Edward, MD Performed: anesthesiologist   Preanesthetic Checklist Completed: patient identified, pre-op evaluation, timeout performed, IV checked, risks and benefits discussed and monitors and equipment checked  Epidural Patient position: sitting Prep: DuraPrep Patient monitoring: blood pressure and continuous pulse ox Approach: midline Location: L3-L4 Injection technique: LOR air  Needle:  Needle type: Tuohy  Needle gauge: 17 G Needle length: 9 cm Needle insertion depth: 6 cm Catheter size: 19 Gauge Catheter at skin depth: 11 cm Test dose: negative and Other (1% Lidocaine)  Additional Notes Patient identified.  Risk benefits discussed including failed block, incomplete pain control, headache, nerve damage, paralysis, blood pressure changes, nausea, vomiting, reactions to medication both toxic or allergic, and postpartum back pain.  Patient expressed understanding and wished to proceed.  All questions were answered.  Sterile technique used throughout procedure and epidural site dressed with sterile barrier dressing. No paresthesia or other complications noted. The patient did not experience any signs of intravascular injection such as tinnitus or metallic taste in mouth nor signs of intrathecal spread such as rapid motor block. Please see nursing notes for vital signs. Reason for block:procedure for pain

## 2017-10-23 NOTE — H&P (Signed)
Obstetric History and Physical  Erin Barry is a 22 y.o. G2P0010 with IUP at 1075w0d presenting for IOL for poor fetal growth. Patient states she has been having  none contractions, none vaginal bleeding, intact membranes, with active fetal movement.    Prenatal Course Source of Care: [facility] with onset of care at 31 weeks; transfer from [facility] Dating: By LMP --->  Estimated Date of Delivery: 10/30/17 Pregnancy complications or risks: Patient Active Problem List   Diagnosis Date Noted  . IUGR (intrauterine growth restriction) affecting care of mother 10/23/2017  . Supervision of high risk pregnancy, antepartum 08/28/2017   She plans to breastfeed She is undecided for postpartum contraception.   Sono:    @[redacted]w[redacted]d , CWD, normal anatomy, cephalic presentation, posterior placenta, 2051g, 20% EFW, BPP 8/8, AFI wnl  Prenatal labs and studies: ABO, Rh: O/Positive/-- (11/13 0000) Antibody: Negative (11/13 0000) Rubella: Nonimmune (11/13 0000) RPR: Nonreactive (01/24 0000)  HBsAg: Negative (11/13 0000)  HIV: Non-reactive (01/24 0000)  WUJ:WJXBJYNWGBS:Negative (03/20 0000)negative 1 hr Glucola  normal Genetic screening normal Anatomy US normal  Prenatal Transfer Tool  Maternal Diabetes: No Genetic Screening: Normal Maternal Ultrasounds/Referrals: Abnormal:  Findings:   Other: poor fetal growth Fetal Ultrasounds or other Referrals:  Referred to Materal Fetal Medicine  Maternal Substance Abuse:  No Significant Maternal Medications:  None Significant Maternal Lab Results: None  Past Medical History:  Diagnosis Date  . Migraine   . Obese   . Pseudotumor cerebri    "Intracranial HTN" - No longer active, no medications    No past surgical history on file.  OB History  Gravida Para Term Preterm AB Living  2 0 0 0 1 0  SAB TAB Ectopic Multiple Live Births  1 0 0 0 0    # Outcome Date GA Lbr Len/2nd Weight Sex Delivery Anes PTL Lv  2 Current           1 SAB  2874w0d    SAB       Social History    Socioeconomic History  . Marital status: Single    Spouse name: Not on file  . Number of children: 0  . Years of education: 7712  . Highest education level: Not on file  Occupational History  . Occupation: IT sales professionalales associate    Comment: [employer]  Social Needs  . Financial resource strain: Not on file  . Food insecurity:    Worry: Not on file    Inability: Not on file  . Transportation needs:    Medical: Not on file    Non-medical: Not on file  Tobacco Use  . Smoking status: Never Smoker  . Smokeless tobacco: Never Used  Substance and Sexual Activity  . Alcohol use: No  . Drug use: No  . Sexual activity: Yes    Birth control/protection: Condom  Lifestyle  . Physical activity:    Days per week: Not on file    Minutes per session: Not on file  . Stress: Not on file  Relationships  . Social connections:    Talks on phone: Not on file    Gets together: Not on file    Attends religious service: Not on file    Active member of club or organization: Not on file    Attends meetings of clubs or organizations: Not on file    Relationship status: Not on file  Other Topics Concern  . Not on file  Social History Narrative   Lives at home   Single  Right-handed   Drinks about 2 soft drinks per day    Family History  Problem Relation Age of Onset  . Migraines Mother   . High blood pressure Maternal Grandfather   . High blood pressure Maternal Grandmother     Medications Prior to Admission  Medication Sig Dispense Refill Last Dose  . Prenatal Vit-Fe Fumarate-FA (PRENATAL MULTIVITAMIN) TABS tablet Take 1 tablet by mouth daily at 12 noon.   Taking    No Known Allergies  Review of Systems: Negative except for what is mentioned in HPI.  Physical Exam: Ht 5\' 3"  (1.6 m)   Wt 202 lb 9.6 oz (91.9 kg)   LMP 01/23/2017 (Exact Date)   BMI 35.89 kg/m  CONSTITUTIONAL: Well-developed, well-nourished female in no acute distress.  HENT:  Normocephalic, atraumatic, External right  and left ear normal. Oropharynx is clear and moist EYES: Conjunctivae and EOM are normal. Pupils are equal, round, and reactive to light. No scleral icterus.  NECK: Normal range of motion, supple, no masses SKIN: Skin is warm and dry. No rash noted. Not diaphoretic. No erythema. No pallor. NEUROLOGIC: Alert and oriented to person, place, and time. Normal reflexes, muscle tone coordination. No cranial nerve deficit noted. PSYCHIATRIC: Normal mood and affect. Normal behavior. Normal judgment and thought content. CARDIOVASCULAR: Normal heart rate noted, regular rhythm RESPIRATORY: Effort and breath sounds normal, no problems with respiration noted ABDOMEN: Soft, nontender, nondistended, gravid. MUSCULOSKELETAL: Normal range of motion. No edema and no tenderness. 2+ distal pulses.  Cervical Exam: deferred to nurse FHT:  Baseline rate 140 bpm   Variability moderate  Accelerations present   Decelerations none Contractions: None   Pertinent Labs/Studies:   No results found for this or any previous visit (from the past 24 hour(s)).  Assessment : Erin Barry is a 22 y.o. G2P0010 at [redacted]w[redacted]d being admitted for induction of labor due to poor fetal growth.  Plan: Labor: Induction to begin with Cytotec.  Analgesia as needed. FWB: Reassuring fetal heart tracing.  GBS negative Delivery plan: Hopeful for vaginal delivery   Caryl Ada, DO OB Fellow Faculty Practice, [facility] 10/23/2017, 8:00 AM

## 2017-10-24 ENCOUNTER — Encounter (HOSPITAL_COMMUNITY): Payer: Self-pay

## 2017-10-24 DIAGNOSIS — O365930 Maternal care for other known or suspected poor fetal growth, third trimester, not applicable or unspecified: Secondary | ICD-10-CM

## 2017-10-24 DIAGNOSIS — Z3A39 39 weeks gestation of pregnancy: Secondary | ICD-10-CM

## 2017-10-24 MED ORDER — PRENATAL MULTIVITAMIN CH
1.0000 | ORAL_TABLET | Freq: Every day | ORAL | Status: DC
  Administered 2017-10-25 – 2017-10-26 (×2): 1 via ORAL
  Filled 2017-10-24 (×2): qty 1

## 2017-10-24 MED ORDER — WITCH HAZEL-GLYCERIN EX PADS: 1.0000 "application " | MEDICATED_PAD | CUTANEOUS | Status: DC | PRN

## 2017-10-24 MED ORDER — ONDANSETRON HCL 4 MG/2ML IJ SOLN: 4.0000 mg | INTRAMUSCULAR | Status: DC | PRN

## 2017-10-24 MED ORDER — PROMETHAZINE HCL 25 MG/ML IJ SOLN
12.5000 mg | Freq: Four times a day (QID) | INTRAMUSCULAR | Status: DC | PRN
  Administered 2017-10-24: 12.5 mg via INTRAVENOUS
  Filled 2017-10-24: qty 1

## 2017-10-24 MED ORDER — ONDANSETRON HCL 4 MG PO TABS: 4.0000 mg | ORAL_TABLET | ORAL | Status: DC | PRN

## 2017-10-24 MED ORDER — LACTATED RINGERS IV SOLN
INTRAVENOUS | Status: DC
  Administered 2017-10-24 (×2): via INTRAUTERINE

## 2017-10-24 MED ORDER — SENNOSIDES-DOCUSATE SODIUM 8.6-50 MG PO TABS
2.0000 | ORAL_TABLET | ORAL | Status: DC
  Administered 2017-10-24 – 2017-10-26 (×2): 2 via ORAL
  Filled 2017-10-24 (×2): qty 2

## 2017-10-24 MED ORDER — ACETAMINOPHEN 325 MG PO TABS
650.0000 mg | ORAL_TABLET | ORAL | Status: DC | PRN
  Administered 2017-10-25 – 2017-10-26 (×5): 650 mg via ORAL
  Filled 2017-10-24 (×5): qty 2

## 2017-10-24 MED ORDER — COCONUT OIL OIL: 1.0000 "application " | TOPICAL_OIL | Status: DC | PRN

## 2017-10-24 MED ORDER — DIBUCAINE 1 % RE OINT: 1.0000 "application " | TOPICAL_OINTMENT | RECTAL | Status: DC | PRN

## 2017-10-24 MED ORDER — OXYTOCIN 40 UNITS IN LACTATED RINGERS INFUSION - SIMPLE MED
1.0000 m[IU]/min | INTRAVENOUS | Status: DC
  Administered 2017-10-24: 4 m[IU]/min via INTRAVENOUS
  Administered 2017-10-24: 3 m[IU]/min via INTRAVENOUS

## 2017-10-24 MED ORDER — SIMETHICONE 80 MG PO CHEW: 80.0000 mg | CHEWABLE_TABLET | ORAL | Status: DC | PRN

## 2017-10-24 MED ORDER — TETANUS-DIPHTH-ACELL PERTUSSIS 5-2.5-18.5 LF-MCG/0.5 IM SUSP: 0.5000 mL | Freq: Once | INTRAMUSCULAR | Status: DC

## 2017-10-24 MED ORDER — IBUPROFEN 600 MG PO TABS
600.0000 mg | ORAL_TABLET | Freq: Four times a day (QID) | ORAL | Status: DC
  Administered 2017-10-24 – 2017-10-26 (×10): 600 mg via ORAL
  Filled 2017-10-24 (×9): qty 1

## 2017-10-24 MED ORDER — ZOLPIDEM TARTRATE 5 MG PO TABS: 5.0000 mg | ORAL_TABLET | Freq: Every evening | ORAL | Status: DC | PRN

## 2017-10-24 MED ORDER — DIPHENHYDRAMINE HCL 25 MG PO CAPS: 25.0000 mg | ORAL_CAPSULE | Freq: Four times a day (QID) | ORAL | Status: DC | PRN

## 2017-10-24 MED ORDER — BENZOCAINE-MENTHOL 20-0.5 % EX AERO
1.0000 "application " | INHALATION_SPRAY | CUTANEOUS | Status: DC | PRN
  Administered 2017-10-24: 1 via TOPICAL
  Filled 2017-10-24: qty 56

## 2017-10-24 NOTE — Progress Notes (Signed)
Entered room after hearing much loud shouting and profanity coming from room  into hallway and at nurses station. Father of baby came into hallway and loudly shouting to " make her leave"". I entered room and patient's mother, grandmother of baby, was shouting at patient and FOB.  FOB stated he asked her to wash her hands and  remarks  between patient, FOB and grandmother escalated. Security called to room. Grandmother angrily shouted at patient and FOB and left room.

## 2017-10-24 NOTE — Anesthesia Postprocedure Evaluation (Signed)
Anesthesia Post Note  Patient: Talene Pokorny  Procedure(s) Performed: AN AD HOC LABOR EPIDURAL     Patient location during evaluation: Mother Baby Anesthesia Type: Epidural Level of consciousness: awake and alert Pain management: pain level controlled Vital Signs Assessment: post-procedure vital signs reviewed and stable Respiratory status: spontaneous breathing, nonlabored ventilation and respiratory function stable Cardiovascular status: stable Postop Assessment: no headache, no backache, epidural receding, adequate PO intake, no apparent nausea or vomiting and patient able to bend at knees Anesthetic complications: no    Last Vitals:  Vitals:   10/24/17 1530 10/24/17 1604  BP: (!) 129/91 129/71  Pulse: 91 98  Resp: 18 18  Temp:    SpO2:  98%    Last Pain:  Vitals:   10/24/17 1545  TempSrc:   PainSc: 1    Pain Goal: Patients Stated Pain Goal: 3 (10/24/17 1545)               Carlina Derks Hristova

## 2017-10-24 NOTE — Progress Notes (Signed)
LABOR PROGRESS NOTE  Erin Barry is a 22 y.o. G2P0010 at 5149w0d  admitted for IOL for poor fetal growth  Subjective: Patient doing well.  In the room to evaluate for recurrent variable decels. Pitocin turned off earlier d/t recurrent decles. FHT improved, now patient spontaneously with several contractions q1-2, and prolonged decel.  Objective: BP 111/67   Pulse 69   Temp 98.2 F (36.8 C) (Oral)   Resp 18   Ht 5\' 3"  (1.6 m)   Wt 202 lb 9.6 oz (91.9 kg)   LMP 01/23/2017 (Exact Date)   SpO2 100%   BMI 35.89 kg/m  or  Vitals:   10/24/17 0301 10/24/17 0331 10/24/17 0401 10/24/17 0501  BP: (!) 100/49 (!) 98/55 102/60 111/67  Pulse: 80 78 96 69  Resp:  18  18  Temp:      TempSrc:      SpO2:      Weight:      Height:        SVE: Dilation: 5.5 Effacement (%): 80 Cervical Position: Middle Station: -2 Presentation: Vertex Exam by:: Erin Barry FHT: baseline rate 135, moderate varibility, + acel, recurrent variable decels Toco: ctx q 3-5 min  Assessment / Plan: 22 y.o. G2P0010 at 5849w0d here for poor fetal growth  Labor: Pitocin off d/t above. Will work on BP. Plan to restart Pitocin soon. Fetal Wellbeing:  Cat II. Amnioinfusion going. Will give phenylephrine for BP and additional IVF bolus.  Pain Control:  well-controlled with epidural Anticipated MOD:  SVD  Frederik PearJulie P Auna Mikkelsen, MD 10/24/2017, 5:06 AM

## 2017-10-24 NOTE — Progress Notes (Signed)
Subjective: Doing well, pain controlled. S/p epidural   Objective: BP 108/60   Pulse 98   Temp 98.4 F (36.9 C) (Oral)   Resp 16   Ht 5\' 3"  (1.6 m)   Wt 91.9 kg (202 lb 9.6 oz)   LMP 01/23/2017 (Exact Date)   SpO2 100%   BMI 35.89 kg/m  I/O last 3 completed shifts: In: -  Out: 600 [Urine:600] No intake/output data recorded.  FHT:  FHR: 135 bpm, variability: minimal ,  accelerations:  Abscent,  decelerations:  Absent UC:   regular, every 4 minutes SVE:   Dilation: 5.5 Effacement (%): 80 Station: -2 Exam by:: Irving BurtonEmily Rothermel RN   Labs: Lab Results  Component Value Date   WBC 6.6 10/23/2017   HGB 10.6 (L) 10/23/2017   HCT 31.1 (L) 10/23/2017   MCV 85.0 10/23/2017   PLT 190 10/23/2017    Assessment / Plan: Induction of labor due to poor fetal growth,  progressing well on pitocin. Receiving amnioinfusion. FB out. S/P cytotec.  Labor: Progressing on Pitocin Preeclampsia:  N/a Fetal Wellbeing:  Category I Pain Control:  Epidural I/D:  n/a Anticipated MOD:  NSVD  Erin BuddyJacob Jashon Barry 10/24/2017, 9:17 AM

## 2017-10-24 NOTE — Progress Notes (Signed)
LABOR PROGRESS NOTE  Kathline Magicmone Saye is a 22 y.o. G2P0010 at 4535w0d  admitted for IOL for poor fetal growth  Subjective: Patient doing well.  Objective: BP 121/76   Pulse 77   Temp 98.1 F (36.7 C) (Oral)   Resp 18   Ht 5\' 3"  (1.6 m)   Wt 202 lb 9.6 oz (91.9 kg)   LMP 01/23/2017 (Exact Date)   SpO2 100%   BMI 35.89 kg/m  or  Vitals:   10/23/17 2301 10/23/17 2305 10/23/17 2332 10/24/17 0001  BP: (!) 99/51  113/71 121/76  Pulse: 66  65 77  Resp: 18     Temp:  98.1 F (36.7 C)    TempSrc:  Oral    SpO2:      Weight:      Height:        SVE: Dilation: 4.5 Effacement (%): 70 Cervical Position: Middle Station: -2 Presentation: Vertex Exam by:: Irving BurtonEmily Rothermel RN  FHT: baseline rate 150, moderate varibility, + acel, variable decels Toco: ctx q 2-3 min  Assessment / Plan: 22 y.o. G2P0010 at 3435w0d here for poor fetal growth  Labor: AROM @00 :10 with clear fluid. IUPC placed. Continue to titrate Pitocin Fetal Wellbeing:  Cat II; start amnioinfusion for variable decelerations Pain Control:  Well-controlled with epidural Anticipated MOD:  SVD  Frederik PearJulie P Degele, MD 10/24/2017, 12:21 AM

## 2017-10-24 NOTE — Progress Notes (Signed)
Labor Progress Note  Erin Barry is a 22 y.o. G2P0010 at 6846w1d  admitted for induction of labor due to poor fetal growth.  S: Doing well. Had episode of emesis that is improved s/p medication.   O:  BP (!) 110/59   Pulse 98   Temp 98.9 F (37.2 C) (Oral)   Resp 18   Ht 5\' 3"  (1.6 m)   Wt 202 lb 9.6 oz (91.9 kg)   LMP 01/23/2017 (Exact Date)   SpO2 100%   BMI 35.89 kg/m   No intake/output data recorded.  FHT:  FHR: 160 bpm, variability: moderate,  accelerations:  Present,  decelerations:  Present variables UC:   regular, every 3-5 minutes SVE:   Dilation: 10 Effacement (%): 80 Station: Plus 2 Exam by:: Dr. Doroteo GlassmanPhelps AROM: 0007, clear  Pitocin @ 6 mu/min  Labs: Lab Results  Component Value Date   WBC 6.6 10/23/2017   HGB 10.6 (L) 10/23/2017   HCT 31.1 (L) 10/23/2017   MCV 85.0 10/23/2017   PLT 190 10/23/2017    Assessment / Plan: 22 y.o. G2P0010 5746w1d in active labor Augmentation of labor, progressing well  Labor: Progressing normally. Now complete. Will labor down.  Fetal Wellbeing:  Category II Pain Control:  Epidural Anticipated MOD:  NSVD  Expectant management   Erin AdaJazma Rocsi Hazelbaker, DO OB Fellow Center for [facility], [facility]

## 2017-10-25 NOTE — Progress Notes (Addendum)
Post Partum Day 1 Subjective: no complaints, up ad lib, voiding, tolerating PO and + flatus  Objective: Blood pressure 122/71, pulse 90, temperature 98.6 F (37 C), temperature source Oral, resp. rate 18, height 5\' 3"  (1.6 m), weight 91.9 kg (202 lb 9.6 oz), last menstrual period 01/23/2017, SpO2 99 %, unknown if currently breastfeeding.  Physical Exam:  General: alert and cooperative Lochia: appropriate Uterine Fundus: firm Incision: N/A DVT Evaluation: No evidence of DVT seen on physical exam. No cords or calf tenderness.  Recent Labs    10/23/17 0820  HGB 10.6*  HCT 31.1*    Assessment/Plan: Plan for discharge tomorrow, Breastfeeding, Circumcision in outpatient clinic and Contraception Still undecided   LOS: 2 days   Myrene BuddyJacob Fletcher 10/25/2017, 7:38 AM   CNM attestation Post Partum Day #1 I have seen and examined this patient and agree with above documentation in the resident's note.   Kathline Magicmone Savarino is a 22 y.o. G2P1011 s/p SVD.  Pt denies problems with ambulating, voiding or po intake. Pain is well controlled.  Plan for birth control is undecided.  Method of Feeding: breast  PE:  BP 130/70   Pulse 69   Temp 97.9 F (36.6 C) (Oral)   Resp 18   Ht 5\' 3"  (1.6 m)   Wt 91.9 kg (202 lb 9.6 oz)   LMP 01/23/2017 (Exact Date)   SpO2 99%   Breastfeeding? Unknown   BMI 35.89 kg/m  Fundus firm  Plan for discharge: 10/26/17  Cam HaiSHAW, KIMBERLY, CNM 8:57 AM 10/26/2017

## 2017-10-25 NOTE — Lactation Note (Addendum)
This note was copied from a baby's chart. Lactation Consultation Note Baby 28 hrs old, has had no actual feeding. He has been at the breast but not actively feeding w/depth. LC concerned d/t baby hasn't had any feedings, or supplementation. Large pendulous breast w/short shaft nipple d/t edema to lower breast. Nipple and areola at the bottom end of the breast. Reverse pressure to areola helpful demonstrated to mom and encouraged mom to do that before attempting to latch. Nipple compresses flat. Nipple has very short shaft that does evert still a short shaft but w/reverse pressure softens to compress. Shells given and strongly encouraged to wear. Hand pump given to pre-pump to evert nipple prior to latching. Baby will not attempt to latch. LC in hopes when edema resolves nipple will evert more and be more compressible for baby to latch w/o NS. Stressed to mom not to lay baby under her breast since he's little. Placed cloth under mom's breast for support.  Fitted mom w/#20 NS. Difficulty getting baby interested in latching. Latched twice w/a few sucks then no interest. NS is going to be challenging to mom d/t she can't see her nipple d/t long large breast. Mom flips nipple back when holding breast to see nipple. Asked mom to call for assistance w/next feeding and application of NS. Spoon fed 1 ml colostrum. A lot of work. Baby not interested, massaged cheeks and jaw to get baby to swallow. W/gloved finger attempted suck training. Baby would not suckle on gloved finger. Baby has thick upper ridge gum line. A wide palate. Baby can open very wide. Doesn't have a small mouth.  2 ml colostrum given in bottle w/slow flow nipple. Baby drank well at first. Baby had large mucous emesis w/some formula/colostrum. Discussed feeding behavior of newborns.  LPI information supplementing sheet given and reviewed d/t weight and need to supplement. Stressed importance of feedings and strict I&O. Mom is to alert RN if can't  stimulate to feed. Discussed ways of stimulation to feed. Encouraged STS as much as possible.  Mom using personal Medela DEBP.no collection of colostrum. Mom hand expressed 3 ml which is what was given to baby.  Praised mom for pumping and hand expressing. Suggested using hospital DEBP d/t preemie setting for x-tra stimulation. Mom in agreement. LC left kit in rm. Asked RN to get DEBP and set up.  Mom knows to pump q3h for 15-20 min. And hand express afterwards. Milk storage reviewed.  Mom encouraged to feed baby 8-12 times/24 hours and with feeding cues. If baby hasn't cued in 3 hrs, stimulate baby to feed. Not to fed longer than 30 min at a time.  [place] brochure given w/resources, support groups and [place] services.  Patient Name: Erin Barry [id number]'V Date: 10/25/2017 Reason for consult: Initial assessment;Infant < 6lbs;Difficult latch   Maternal Data Has patient been taught Hand Expression?: Yes Does the patient have breastfeeding experience prior to this delivery?: No  Feeding Feeding Type: Breast Milk with Formula added Nipple Type: Slow - flow Length of feed: 0 min  LATCH Score Latch: Too sleepy or reluctant, no latch achieved, no sucking elicited.  Audible Swallowing: None  Type of Nipple: Everted at rest and after stimulation(very short shaft, semi flat)  Comfort (Breast/Nipple): Soft / non-tender  Hold (Positioning): Full assist, staff holds infant at breast  LATCH Score: 4  Interventions Interventions: Breast feeding basics reviewed;Support pillows;Assisted with latch;Position options;Skin to skin;Expressed milk;Breast massage;Hand express;Shells;Pre-pump if needed;Reverse pressure;Hand pump;Breast compression;DEBP;Adjust position  Lactation Tools Discussed/Used Tools:  Shells;Pump;Nipple [place] Fuel;Bottle Nipple shield size: 20 Shell Type: Inverted Breast pump type: Manual;Double-Electric Breast Pump WIC Program: Yes Pump Review: Setup, frequency, and cleaning;Milk  Storage Initiated by:: Allayne Stack RN IBCLC Date initiated:: 10/25/17   Consult Status Consult Status: Follow-up Date: 10/25/17 Follow-up type: In-patient    Erin Barry, Elta Gu[place] 10/25/2017, 2:01 AM

## 2017-10-26 MED ORDER — MEASLES, MUMPS & RUBELLA VAC ~~LOC~~ INJ
0.5000 mL | INJECTION | Freq: Once | SUBCUTANEOUS | Status: AC
  Administered 2017-10-26: 0.5 mL via SUBCUTANEOUS
  Filled 2017-10-26 (×2): qty 0.5

## 2017-10-26 MED ORDER — CYCLOBENZAPRINE HCL 7.5 MG PO TABS: 7.5000 mg | ORAL_TABLET | Freq: Three times a day (TID) | ORAL | 0 refills | Status: DC | PRN

## 2017-10-26 MED ORDER — IBUPROFEN 600 MG PO TABS: 600.0000 mg | ORAL_TABLET | Freq: Four times a day (QID) | ORAL | 0 refills | Status: DC

## 2017-10-26 NOTE — Discharge Summary (Addendum)
OB Discharge Summary     Patient Name: Erin Barry DOB: 1995-08-09 MRN: [id number]  Date of admission: 10/23/2017 Delivering MD: Myrene BuddyFLETCHER, JACOB   Date of discharge: 10/26/2017  Admitting diagnosis: INDUCTION Intrauterine pregnancy: 9820w1d     Secondary diagnosis:  Principal Problem:   Supervision of high risk pregnancy, antepartum Active Problems:   IUGR (intrauterine growth restriction) affecting care of mother   SVD (spontaneous vaginal delivery)  Additional problems: IOL for IUGR prior to delivery     Discharge diagnosis: Term Pregnancy Delivered                                                                                                Post partum procedures:none  Augmentation: AROM, Pitocin, Cytotec and Foley Balloon  Complications: None  Hospital course:  Induction of Labor With Vaginal Delivery   22 y.o. yo G2P1011 at 6020w1d was admitted to the hospital 10/23/2017 for induction of labor.  Indication for induction: IUGR.  Patient had an uncomplicated labor course as follows: Membrane Rupture Time/Date: 12:07 AM ,10/24/2017   Intrapartum Procedures: Episiotomy: None [1]                                         Lacerations:  Sulcus [9]  Patient had delivery of a Viable infant.  Information for the patient's newborn:  Nada LibmanFaniel, Boy Remas [[id number]][[id number]]  Delivery Method: Vag-Spont   10/24/2017  Details of delivery can be found in separate delivery note.  Patient had a routine postpartum course. Patient is discharged home 10/26/17.  Physical exam  Vitals:   10/24/17 2123 10/25/17 0529 10/25/17 1728 10/26/17 0511  BP: 136/80 122/71 129/72 130/70  Pulse: 97 90 99 69  Resp: 18 18 18 18   Temp: 98.4 F (36.9 C) 98.6 F (37 C) 98.5 F (36.9 C) 97.9 F (36.6 C)  TempSrc: Oral Oral Oral Oral  SpO2: 100% 99%    Weight:      Height:       General: alert, cooperative and no distress Lochia: appropriate Uterine Fundus: firm Incision: N/A DVT Evaluation: No evidence of DVT  seen on physical exam. Negative Homan's sign. Labs: Lab Results  Component Value Date   WBC 6.6 10/23/2017   HGB 10.6 (L) 10/23/2017   HCT 31.1 (L) 10/23/2017   MCV 85.0 10/23/2017   PLT 190 10/23/2017   CMP Latest Ref Rng & Units 11/02/2015  Glucose 65 - 99 mg/dL 82  BUN 6 - 20 mg/dL 18  Creatinine 7.820.44 - 9.561.00 mg/dL 2.130.90  Sodium 086135 - 578145 mmol/L 142  Potassium 3.5 - 5.1 mmol/L 3.7  Chloride 101 - 111 mmol/L 102    Discharge instruction: per After Visit Summary and "Baby and Me Booklet".  After visit meds:  Allergies as of 10/26/2017   No Known Allergies     Medication List    STOP taking these medications   prenatal multivitamin Tabs tablet     TAKE these medications   cyclobenzaprine 7.5 MG tablet Commonly known  as:  FEXMID Take 1 tablet (7.5 mg total) by mouth 3 (three) times daily as needed for muscle spasms.   ibuprofen 600 MG tablet Commonly known as:  ADVIL,MOTRIN Take 1 tablet (600 mg total) by mouth every 6 (six) hours.       Diet: routine diet  Activity: Advance as tolerated. Pelvic rest for 6 weeks.   Outpatient follow up:4 weeks Follow up Appt:No future appointments. Follow up Visit:No follow-ups on file.  Postpartum contraception: Undecided  Newborn Data: Live born female  Birth Weight: 5 lb 2.9 oz (2350 g) APGAR: 8, 9  Newborn Delivery   Birth date/time:  10/24/2017 13:42:00 Delivery type:  Vaginal, Spontaneous     Baby Feeding: Breast Disposition:home with mother   10/26/2017 Myrene Buddy, MD  CNM attestation I have seen and examined this patient and agree with above documentation in the resident's note.   Diedra Sinor is a 22 y.o. [id number] s/p IOL for poor fetal growth.   Pain is well controlled.  Plan for birth control is undecided.  Method of Feeding: breast  PE:  BP 130/70   Pulse 69   Temp 97.9 F (36.6 C) (Oral)   Resp 18   Ht 5\' 3"  (1.6 m)   Wt 91.9 kg (202 lb 9.6 oz)   LMP 01/23/2017 (Exact Date)   SpO2 99%    Breastfeeding? Unknown   BMI 35.89 kg/m  Fundus firm  No results for input(s): HGB, HCT in the last 72 hours.   Plan: discharge today - postpartum care discussed - f/u clinic in 4 weeks for postpartum visit   Cam Hai, CNM 9:08 AM 10/26/2017

## 2017-10-27 ENCOUNTER — Ambulatory Visit: Payer: Self-pay

## 2017-10-27 LAB — TYPE AND SCREEN
ABO/RH(D): O POS
Antibody Screen: POSITIVE
Unit division: 0
Unit division: 0

## 2017-10-27 LAB — BPAM RBC
Blood Product Expiration Date: 201905102359
Blood Product Expiration Date: 201905102359
Unit Type and Rh: 5100
Unit Type and Rh: 5100

## 2017-10-27 NOTE — Lactation Note (Signed)
This note was copied from a baby's chart. Lactation Consultation Note  Patient Name: Erin Barry ZOXWR'UToday's Date: 10/27/2017   Baby 69 hours old and < 5 lbs. Mother is pumping approx 25 ml and giving additional formula supplementation with slow flow nipple. Mother has personal DEBP at home. Discussued converting hospital grade pump to her Medela pump at home. Mom encouraged to feed baby 8-12 times/24 hours and with feeding cues at least q hours.  Reviewed engorgement care and monitoring voids/stools. Mother denies questions or concerns.      Maternal Data    Feeding Feeding Type: Breast Milk Nipple Type: Slow - flow  LATCH Score                   Interventions    Lactation Tools Discussed/Used     Consult Status      Hardie PulleyBerkelhammer, Haydin Dunn Boschen 10/27/2017, 11:08 AM

## 2017-10-29 ENCOUNTER — Encounter: Payer: Self-pay | Admitting: General Practice

## 2017-10-30 ENCOUNTER — Encounter: Payer: Managed Care, Other (non HMO) | Admitting: Obstetrics & Gynecology

## 2017-10-30 ENCOUNTER — Other Ambulatory Visit: Payer: Managed Care, Other (non HMO)

## 2017-12-04 ENCOUNTER — Ambulatory Visit: Payer: Managed Care, Other (non HMO) | Admitting: Medical

## 2017-12-04 NOTE — Progress Notes (Deleted)
Post Partum Exam  Erin Barry is a 22 y.o. G32P1011 female who presents for a postpartum visit. She is {1-10:13787} {time; units:18646} postpartum following a {delivery:12449}. I have fully reviewed the prenatal and intrapartum course. The delivery was at *** gestational weeks.  Anesthesia: {anesthesia types:812}. Postpartum course has been ***. Baby's course has been ***. Baby is feeding by {breast/bottle:69}. Bleeding {vag bleed:12292}. Bowel function is {normal:32111}. Bladder function is {normal:32111}. Patient {is/is not:9024} sexually active. Contraception method is {contraceptive method:5051}. Postpartum depression screening:neg  {Common ambulatory SmartLinks:19316} Last pap smear done *** and was {Desc; normal/abnormal:11317::"Normal"}  Review of Systems {ros; complete:30496}    Objective:  Last menstrual period 01/23/2017, unknown if currently breastfeeding.  General:  {gen appearance:16600}   Breasts:  {breast exam:1202::"inspection negative, no nipple discharge or bleeding, no masses or nodularity palpable"}  Lungs: {lung exam:16931}  Heart:  {heart exam:5510}  Abdomen: {abdomen exam:16834}   Vulva:  {labia exam:12198}  Vagina: {vagina exam:12200}  Cervix:  {cervix exam:14595}  Corpus: {uterus exam:12215}  Adnexa:  {adnexa exam:12223}  Rectal Exam: {rectal/vaginal exam:12274}        Assessment:    *** postpartum exam. Pap smear {done:10129} at today's visit.   Plan:   1. Contraception: {method:5051} 2. *** 3. Follow up in: {1-10:13787} {time; units:19136} or as needed.

## 2019-08-08 IMAGING — US US MFM OB FOLLOW-UP
1 series · 14 of 28 positions shown · non-contrast
Comparison: none

[Series 1: us mfm ob follow-up · 47 acquisitions, 14 frames shown]
[im 2/47]
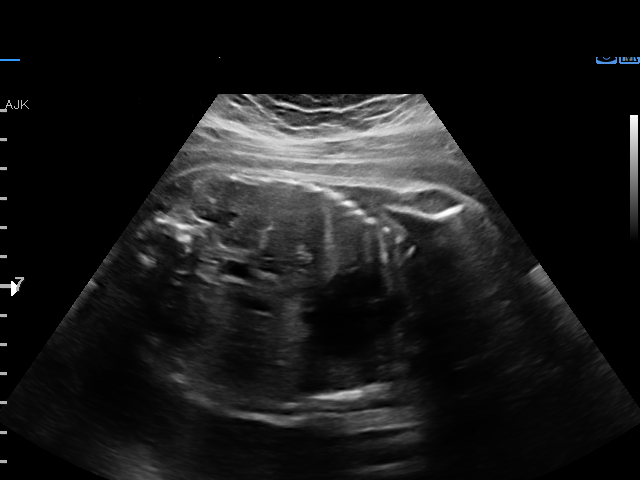
[im 6/47]
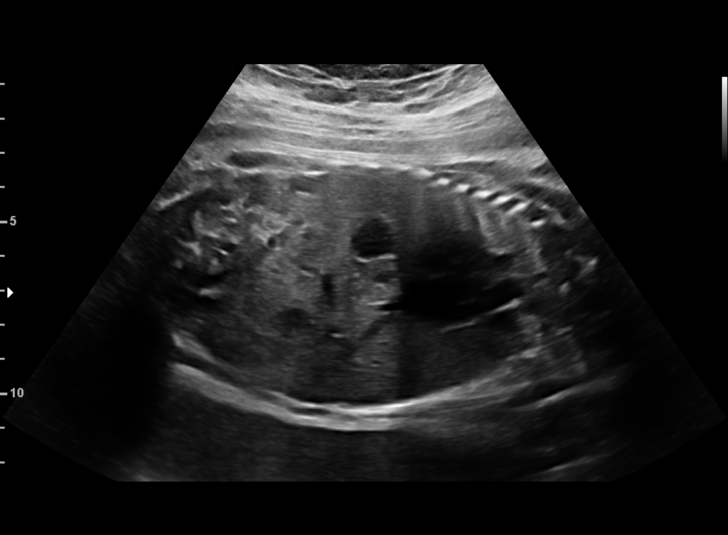
[im 9/47]
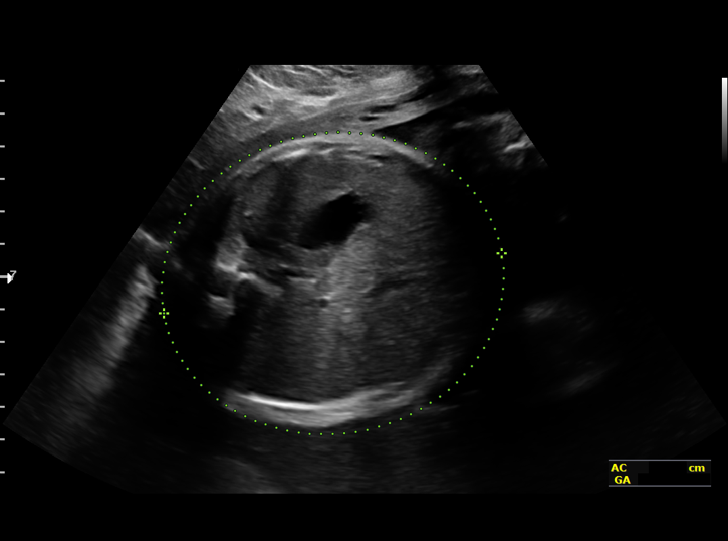
[im 12/47]
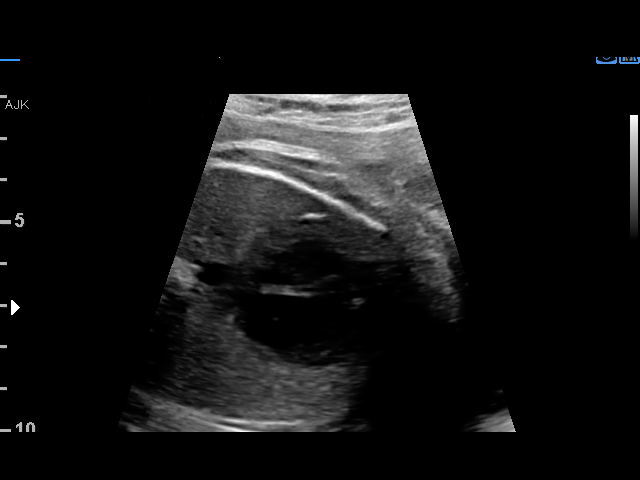
[im 16/47]
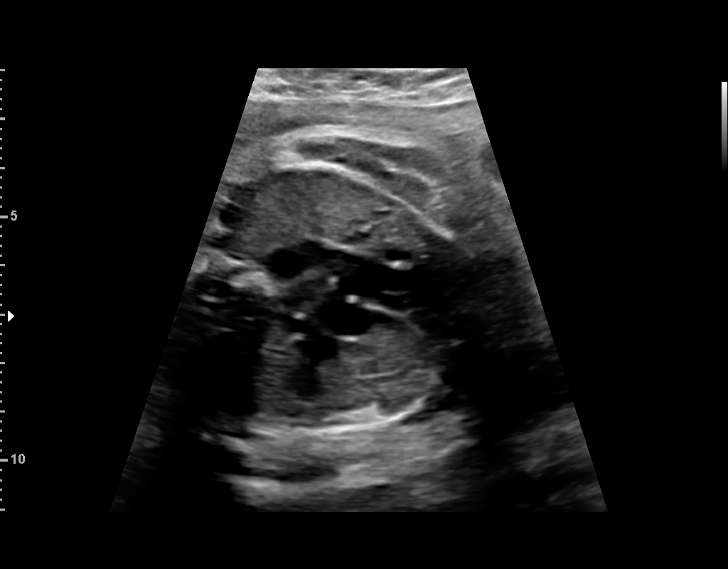
[im 19/47]
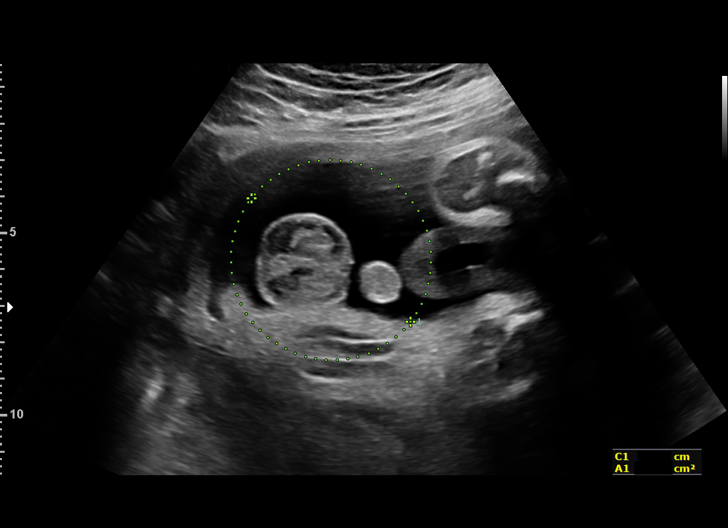
[im 23/47]
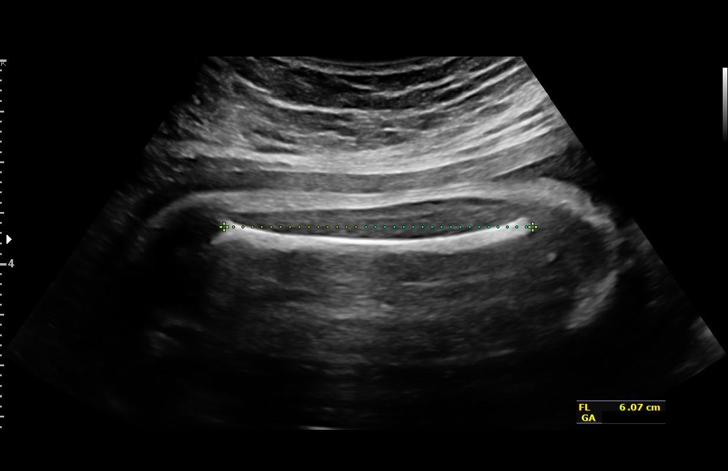
[im 26/47]
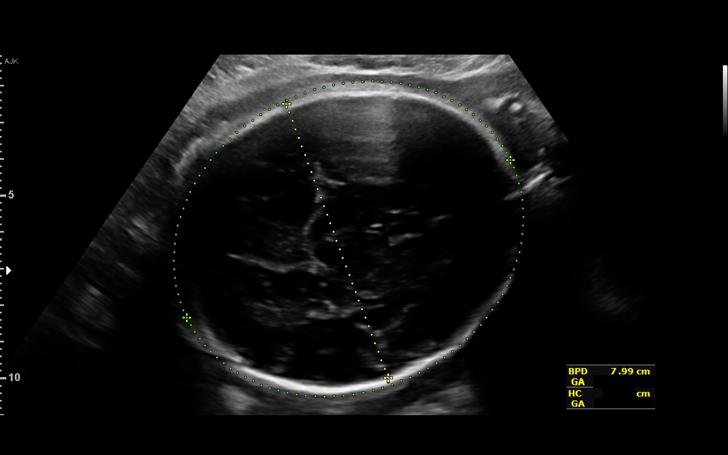
[im 29/47]
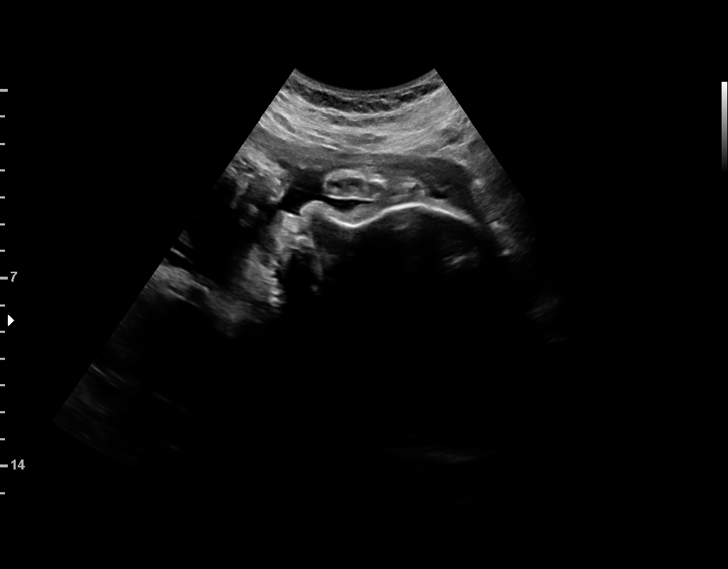
[im 33/47]
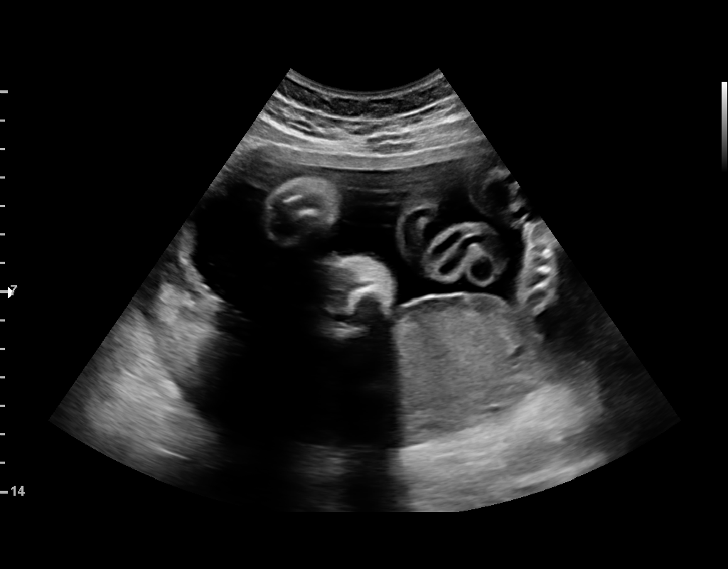
[im 36/47]
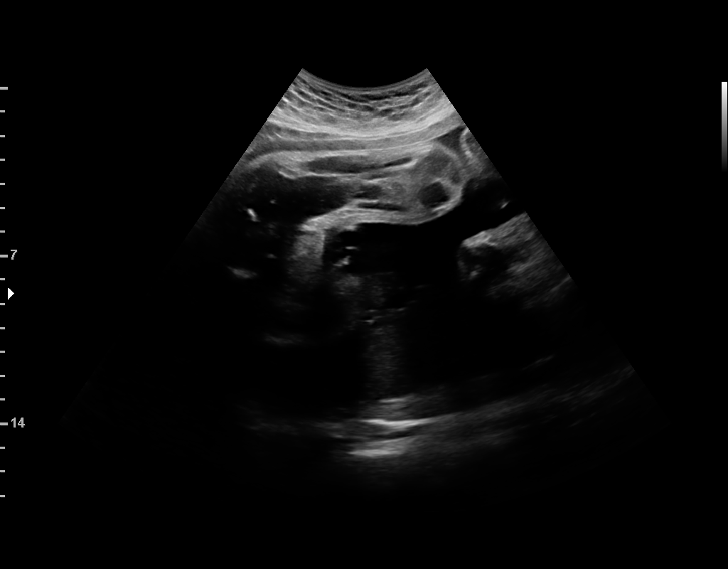
[im 40/47]
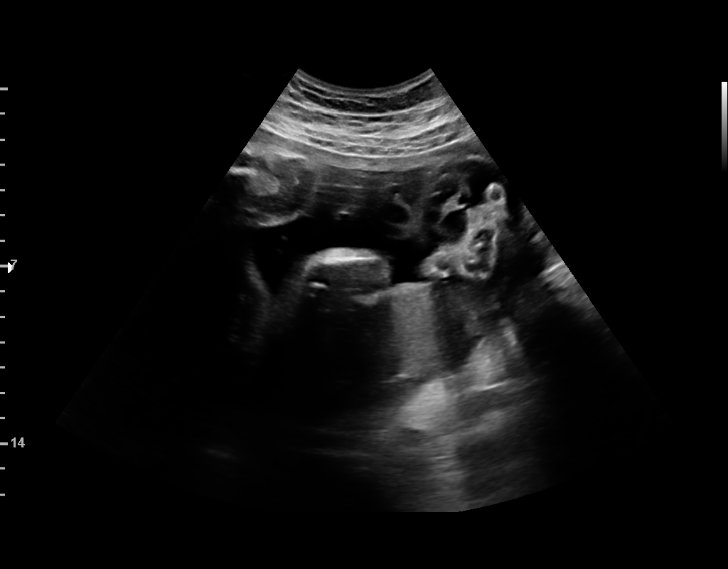
[im 43/47]
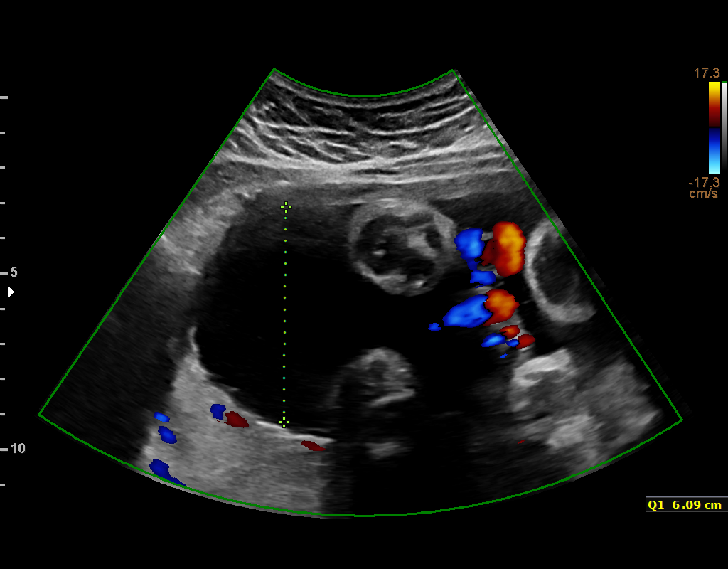
[im 47/47]
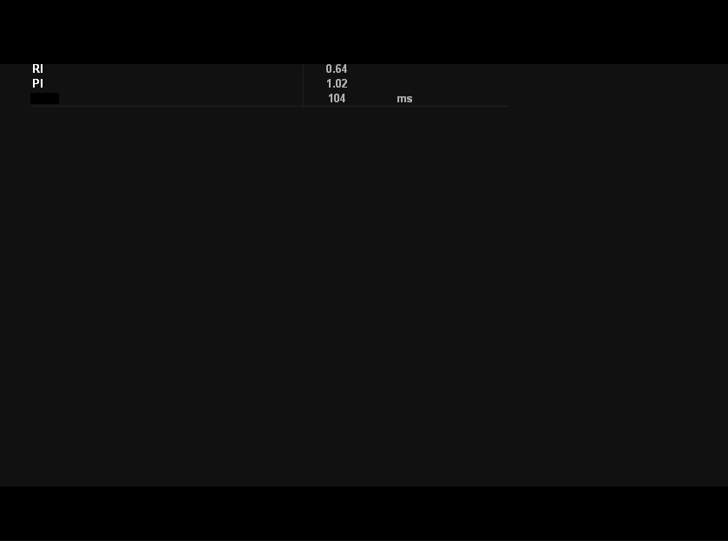

[14 of 28 positions shown; findings below may reference images not displayed]

OB/Gyn Clinic

1  ZENI TIRON MINO              [id number]      [id number]     [id number]
Indications

35 weeks gestation of pregnancy
Maternal care for known or suspected poor
fetal growth, third trimester, not applicable or
unspecified
OB History

Gravidity:    2         Term:   0         SAB:   1
Living:       0
Fetal Evaluation

Num Of Fetuses:     1
Fetal Heart         145
Rate(bpm):
Cardiac Activity:   Observed
Presentation:       Cephalic
Placenta:           Posterior, above cervical os
P. Cord Insertion:  Previously Visualized

Amniotic Fluid
AFI FV:      Subjectively within normal limits

AFI Sum(cm)     %Tile       Largest Pocket(cm)
14.23           51

RUQ(cm)       RLQ(cm)       LUQ(cm)        LLQ(cm)
6.09
Biometry

BPD:      80.1  mm     G. Age:  32w 1d          2  %    CI:        77.61   %    70 - 86
FL/HC:      21.1   %    20.1 -
HC:      287.8  mm     G. Age:  31w 5d        < 3  %    HC/AC:      0.97        0.93 -
AC:      298.2  mm     G. Age:  33w 6d         23  %    FL/BPD:     75.9   %    71 - 87
FL:       60.8  mm     G. Age:  31w 4d        < 3  %    FL/AC:      20.4   %    20 - 24

Est. FW:    7340  gm      4 lb 8 oz     20  %
Gestational Age

LMP:           35w 0d        Date:  01/23/17                 EDD:   10/30/17
U/S Today:     32w 2d                                        EDD:   11/18/17
Best:          35w 0d     Det. By:  LMP  (01/23/17)          EDD:   10/30/17
Impression

Single living intrauterine pregnancy at 35 weeks 0 days.
Appropriate interval fetal growth (EFW 20%, AC 23%).
Normal amniotic fluid volume.
Normal interval fetal anatomy.
BPP [DATE].
Recommendations

Follow-up ultrasounds as clinically indicated.

## 2022-02-27 ENCOUNTER — Encounter (HOSPITAL_COMMUNITY): Payer: Self-pay

## 2022-02-27 ENCOUNTER — Ambulatory Visit (HOSPITAL_COMMUNITY)
Admission: EM | Admit: 2022-02-27 | Discharge: 2022-02-27 | Disposition: A | Discharge status: Home / Self Care | Payer: Medicaid Other | Attending: Internal Medicine | Admitting: Internal Medicine

## 2022-02-27 DIAGNOSIS — K029 Dental caries, unspecified: Secondary | ICD-10-CM | POA: Diagnosis not present

## 2022-02-27 DIAGNOSIS — K0889 Other specified disorders of teeth and supporting structures: Secondary | ICD-10-CM

## 2022-02-27 DIAGNOSIS — K047 Periapical abscess without sinus: Secondary | ICD-10-CM

## 2022-02-27 MED ORDER — AMOXICILLIN-POT CLAVULANATE 875-125 MG PO TABS: 1.0000 | ORAL_TABLET | Freq: Two times a day (BID) | ORAL | 0 refills | Status: AC

## 2022-02-27 MED ORDER — IBUPROFEN 800 MG PO TABS
800.0000 mg | ORAL_TABLET | Freq: Once | ORAL | Status: AC
  Administered 2022-02-27: 800 mg via ORAL

## 2022-02-27 MED ORDER — CHLORHEXIDINE GLUCONATE 0.12 % MT SOLN: 15.0000 mL | Freq: Two times a day (BID) | OROMUCOSAL | 0 refills | Status: AC

## 2022-02-27 MED ORDER — IBUPROFEN 800 MG PO TABS
ORAL_TABLET | ORAL | Status: AC
  Filled 2022-02-27: qty 1

## 2022-02-27 MED ORDER — IBUPROFEN 800 MG PO TABS: 800.0000 mg | ORAL_TABLET | Freq: Three times a day (TID) | ORAL | 0 refills | Status: AC

## 2022-02-27 NOTE — ED Triage Notes (Signed)
Pt c/o rt lower toothache since Saturday. Using Orajel and tylenol with no relief.

## 2022-02-27 NOTE — Discharge Instructions (Signed)
Take Augmentin twice daily for the next 7 days to treat your dental infection. Apply ice to the outside of your face to reduce inflammation and pain. Take 800 mg ibuprofen every 8 hours as needed with food for dental inflammation and pain.  You may also take 1000 mg of Tylenol every 6 hours as needed with this for breakthrough pain.  Schedule an appointment with one of the dentist on the list provided to urgent care today.  If you develop any new or worsening symptoms or do not improve in the next 2 to 3 days, please return.  If your symptoms are severe, please go to the emergency room.  Follow-up with your primary care provider for further evaluation and management of your symptoms as well as ongoing wellness visits.  I hope you feel better!

## 2022-02-27 NOTE — ED Provider Notes (Signed)
[facility]    CSN: [id number] Arrival date & time: 02/27/22  1247      History   Chief Complaint Chief Complaint  Patient presents with   Dental Pain    HPI Erin Barry is a 26 y.o. female.   Patient presents to urgent care for evaluation of right lower dental pain for the last 3 days starting on Saturday, February 24, 2022.  She does not have dental insurance and does not receive routine dental care every 6 months.  Pain is currently significant.  She has tried Orajel and Tylenol for her symptoms without relief of pain.  She reports chills but no fever at home.  States that the right side of her mouth is felt warm to the touch intermittently over the last few days.  Denies drainage from tooth, sore throat, headache, and nasal congestion.  She reports some ear pain that started around the same time as her tooth pain.  She has not attempted use of any anti-inflammatory medications prior to arrival urgent care.   Dental Pain   Past Medical History:  Diagnosis Date   Migraine    Obese    Pseudotumor cerebri    "Intracranial HTN" - No longer active, no medications    Patient Active Problem List   Diagnosis Date Noted   SVD (spontaneous vaginal delivery) 10/24/2017   IUGR (intrauterine growth restriction) affecting care of mother 10/23/2017   Supervision of high risk pregnancy, antepartum 08/28/2017    History reviewed. No pertinent surgical history.  OB History     Gravida  2   Para  1   Term  1   Preterm  0   AB  1   Living  1      SAB  1   IAB  0   Ectopic  0   Multiple  0   Live Births  1            Home Medications    Prior to Admission medications   Medication Sig Start Date End Date Taking? Authorizing Provider  amoxicillin-clavulanate (AUGMENTIN) 875-125 MG tablet Take 1 tablet by mouth every 12 (twelve) hours. 02/27/22  Yes Carlisle Beers, FNP  chlorhexidine (PERIDEX) 0.12 % solution Use as directed 15 mLs in the  mouth or throat 2 (two) times daily. 02/27/22  Yes Carlisle Beers, FNP  ibuprofen (ADVIL) 800 MG tablet Take 1 tablet (800 mg total) by mouth 3 (three) times daily. 02/27/22  Yes StanhopeDonavan Burnet, FNP    Family History Family History  Problem Relation Age of Onset   Migraines Mother    High blood pressure Maternal Grandfather    High blood pressure Maternal Grandmother     Social History Social History   Tobacco Use   Smoking status: Never   Smokeless tobacco: Never  Substance Use Topics   Alcohol use: No   Drug use: No     Allergies   Patient has no known allergies.   Review of Systems Review of Systems Per HPI  Physical Exam Triage Vital Signs ED Triage Vitals [02/27/22 1310]  Enc Vitals Group     BP 130/88     Pulse Rate 83     Resp 18     Temp 98.3 F (36.8 C)     Temp Source Oral     SpO2 99 %     Weight      Height      Head Circumference  Peak Flow      Pain Score 10     Pain Loc      Pain Edu?      Excl. in GC?    No data found.  Updated Vital Signs BP 130/88 (BP Location: Left Arm)   Pulse 83   Temp 98.3 F (36.8 C) (Oral)   Resp 18   LMP 02/25/2022   SpO2 99%   Breastfeeding No   Visual Acuity Right Eye Distance:   Left Eye Distance:   Bilateral Distance:    Right Eye Near:   Left Eye Near:    Bilateral Near:     Physical Exam Vitals and nursing note reviewed.  Constitutional:      Appearance: Normal appearance. She is obese. She is not ill-appearing or toxic-appearing.     Comments: Very pleasant patient sitting on exam in position of comfort table in no acute distress.   HENT:     Head: Normocephalic and atraumatic.     Right Ear: Hearing and external ear normal.     Left Ear: Hearing and external ear normal.     Nose: Nose normal.     Mouth/Throat:     Lips: Pink.     Mouth: Mucous membranes are moist.     Dentition: Abnormal dentition. Dental tenderness and dental caries present.     Pharynx: No  posterior oropharyngeal erythema.      Comments: Back 3 teeth to the right lower aspect of the mouth appear to be infected dental caries with obvious decay to the top of the teeth. Tender to palpation of teeth. Cervical adenopathy to palpation of neck.  No posterior oropharynx erythema.  Eyes:     General: Lids are normal. Vision grossly intact. Gaze aligned appropriately.     Extraocular Movements: Extraocular movements intact.     Conjunctiva/sclera: Conjunctivae normal.  Cardiovascular:     Heart sounds: S1 normal and S2 normal.  Pulmonary:     Effort: No respiratory distress.     Breath sounds: Normal air entry.  Abdominal:     Palpations: Abdomen is soft.  Musculoskeletal:     Cervical back: Neck supple.  Lymphadenopathy:     Cervical: Cervical adenopathy present.  Skin:    General: Skin is warm and dry.     Capillary Refill: Capillary refill takes less than 2 seconds.     Findings: No rash.  Neurological:     General: No focal deficit present.     Mental Status: She is alert and oriented to person, place, and time. Mental status is at baseline.     Cranial Nerves: No dysarthria or facial asymmetry.     Gait: Gait is intact.  Psychiatric:        Mood and Affect: Mood normal.        Speech: Speech normal.        Behavior: Behavior normal.        Thought Content: Thought content normal.        Judgment: Judgment normal.      UC Treatments / Results  Labs (all labs ordered are listed, but only abnormal results are displayed) Labs Reviewed - No data to display  EKG   Radiology No results found.  Procedures Procedures (including critical care time)  Medications Ordered in UC Medications  ibuprofen (ADVIL) tablet 800 mg (800 mg Oral Given 02/27/22 1437)    Initial Impression / Assessment and Plan / UC Course  I have reviewed the triage  vital signs and the nursing notes.  Pertinent labs & imaging results that were available during my care of the patient were  reviewed by me and considered in my medical decision making (see chart for details).   1.  Infected dental caries and dental pain Augmentin twice daily for the next 7 days prescribed to treat dental infection.  Advised patient to apply ice to the outside of her face to reduce inflammation and pain.  She may use ibuprofen 800 mg every 8 hours as needed with food at home.  Patient given list of community resources for dental care and advised to schedule an appointment with one of them for follow-up and treatment.  She may use chlorhexidine mouth rinse 2 times daily as well.  Patient denies allergies to antibiotics/medications.  Vital signs are hemodynamically stable at this time.   Discussed physical exam and available lab work findings in clinic with patient.  Counseled patient regarding appropriate use of medications and potential side effects for all medications recommended or prescribed today. Discussed red flag signs and symptoms of worsening condition,when to call the PCP office, return to urgent care, and when to seek higher level of care in the emergency department. Patient verbalizes understanding and agreement with plan. All questions answered. Patient discharged in stable condition.  Final Clinical Impressions(s) / UC Diagnoses   Final diagnoses:  Infected dental caries  Pain, dental     Discharge Instructions      Take Augmentin twice daily for the next 7 days to treat your dental infection. Apply ice to the outside of your face to reduce inflammation and pain. Take 800 mg ibuprofen every 8 hours as needed with food for dental inflammation and pain.  You may also take 1000 mg of Tylenol every 6 hours as needed with this for breakthrough pain.  Schedule an appointment with one of the dentist on the list provided to urgent care today.  If you develop any new or worsening symptoms or do not improve in the next 2 to 3 days, please return.  If your symptoms are severe, please go to the  emergency room.  Follow-up with your primary care provider for further evaluation and management of your symptoms as well as ongoing wellness visits.  I hope you feel better!    ED Prescriptions     Medication Sig Dispense Auth. Provider   amoxicillin-clavulanate (AUGMENTIN) 875-125 MG tablet Take 1 tablet by mouth every 12 (twelve) hours. 14 tablet Reita May M, FNP   ibuprofen (ADVIL) 800 MG tablet Take 1 tablet (800 mg total) by mouth 3 (three) times daily. 21 tablet Reita May M, FNP   chlorhexidine (PERIDEX) 0.12 % solution Use as directed 15 mLs in the mouth or throat 2 (two) times daily. 120 mL Carlisle Beers, FNP      PDMP not reviewed this encounter.   Carlisle Beers, [place] 03/01/22 1928
